# Patient Record
Sex: Female | Born: 1960 | Race: Black or African American | Hispanic: No | Marital: Married | Smoking: Never smoker
Health system: Southern US, Community
[De-identification: ages and names within clinical notes are randomized; demographics above are authoritative.]

## PROBLEM LIST (undated history)

## (undated) DIAGNOSIS — I10 Essential (primary) hypertension: Secondary | ICD-10-CM

## (undated) HISTORY — DX: Essential (primary) hypertension: I10

## (undated) HISTORY — PX: CHOLECYSTECTOMY: SHX55

---

## 1994-10-15 HISTORY — PX: ABDOMINAL HYSTERECTOMY: SHX81

## 2005-12-28 ENCOUNTER — Ambulatory Visit: Payer: Self-pay | Admitting: Family Medicine

## 2006-01-10 ENCOUNTER — Ambulatory Visit: Payer: Self-pay | Admitting: Family Medicine

## 2006-06-28 ENCOUNTER — Ambulatory Visit: Payer: Self-pay | Admitting: Family Medicine

## 2006-09-02 ENCOUNTER — Telehealth (INDEPENDENT_AMBULATORY_CARE_PROVIDER_SITE_OTHER): Payer: Self-pay | Admitting: *Deleted

## 2008-10-15 HISTORY — PX: OTHER SURGICAL HISTORY: SHX169

## 2011-03-29 LAB — LIPID PANEL
CHOLESTEROL: 175 mg/dL (ref 0–200)
HDL: 58 mg/dL (ref 35–70)
LDL Cholesterol: 102 mg/dL
Triglycerides: 76 mg/dL (ref 40–160)

## 2011-03-29 LAB — CBC AND DIFFERENTIAL
HEMOGLOBIN: 12.9 g/dL (ref 12.0–16.0)
WBC: 6.9 10^3/mL

## 2011-03-29 LAB — BASIC METABOLIC PANEL
CREATININE: 0.7 mg/dL (ref 0.5–1.1)
GLUCOSE: 91 mg/dL
Potassium: 3.7 mmol/L (ref 3.4–5.3)

## 2012-05-06 LAB — CBC AND DIFFERENTIAL
HEMOGLOBIN: 14 g/dL (ref 12.0–16.0)
WBC: 8.5 10^3/mL

## 2012-07-04 LAB — BASIC METABOLIC PANEL
Creatinine: 0.7 mg/dL (ref 0.5–1.1)
GLUCOSE: 86 mg/dL
POTASSIUM: 3.7 mmol/L (ref 3.4–5.3)

## 2012-07-04 LAB — TSH: TSH: 1.53 u[IU]/mL (ref 0.41–5.90)

## 2012-07-04 LAB — CBC AND DIFFERENTIAL
Hemoglobin: 13 g/dL (ref 12.0–16.0)
WBC: 7.1 10^3/mL

## 2012-11-17 LAB — HM MAMMOGRAPHY

## 2012-12-31 LAB — BASIC METABOLIC PANEL WITH GFR
Creatinine: 0.7 mg/dL (ref 0.5–1.1)
Glucose: 90 mg/dL
Potassium: 3.9 mmol/L (ref 3.4–5.3)

## 2012-12-31 LAB — CBC AND DIFFERENTIAL
Hemoglobin: 13.4 g/dL (ref 12.0–16.0)
WBC: 7.1 10*3/mL

## 2012-12-31 LAB — HEPATIC FUNCTION PANEL
ALT: 20 U/L (ref 7–35)
AST: 17 U/L (ref 13–35)

## 2012-12-31 LAB — LIPID PANEL
Cholesterol: 185 mg/dL (ref 0–200)
HDL: 64 mg/dL (ref 35–70)
LDL Cholesterol: 97 mg/dL
Triglycerides: 118 mg/dL (ref 40–160)

## 2012-12-31 LAB — CALCIUM: Calcium: 10.2 mg/dL

## 2013-05-29 LAB — HM COLONOSCOPY

## 2014-02-08 ENCOUNTER — Encounter: Payer: Self-pay | Admitting: Family Medicine

## 2014-02-08 ENCOUNTER — Ambulatory Visit (INDEPENDENT_AMBULATORY_CARE_PROVIDER_SITE_OTHER): Payer: 59 | Admitting: Family Medicine

## 2014-02-08 VITALS — BP 154/91 | HR 73 | Ht 61.0 in | Wt 134.0 lb

## 2014-02-08 DIAGNOSIS — I10 Essential (primary) hypertension: Secondary | ICD-10-CM

## 2014-02-08 DIAGNOSIS — E559 Vitamin D deficiency, unspecified: Secondary | ICD-10-CM | POA: Insufficient documentation

## 2014-02-08 HISTORY — DX: Essential (primary) hypertension: I10

## 2014-02-08 MED ORDER — VALSARTAN 80 MG PO TABS: 80.0000 mg | ORAL_TABLET | Freq: Every day | ORAL | Status: DC

## 2014-02-08 NOTE — Progress Notes (Signed)
CC: Penny Carpenter iGovernor Rookss a 53 y.o. female is here for Establish Care and Hypertension   Subjective: HPI:  Very pleasant 53 year old here to establish care  Reports a history of hypertension spanning back over a decade.  Over the past month she has noticed multiple readings at home in the stage I hypertension range never in stage II range.  Symptoms seem to be worse with caffeine elevated blood pressure. Nothing particularly makes better other than lisinopril and 10 years ago Diovan. States that she had to stop taking Diovan due to insurance reasons, she believes her blood pressure was much better controlled on this medication. Blood pressure to be elevated or normotensive anytime of the day, no predictability.  She reports a history of vitamin D deficiency found in January of this year. She denies any recent or remote fatigue, muscular skeletal pain, nor mood disturbance. She has been taking 1000-2000 units of vitamin D on a daily basis without side effects. Over the past 3 months she's also tried to spend more time outside.  Review of Systems - General ROS: negative for - chills, fever, night sweats, weight gain or weight loss Ophthalmic ROS: negative for - decreased vision Psychological ROS: negative for - anxiety or depression ENT ROS: negative for - hearing change, nasal congestion, tinnitus or allergies Hematological and Lymphatic ROS: negative for - bleeding problems, bruising or swollen lymph nodes Breast ROS: negative Respiratory ROS: no cough, shortness of breath, or wheezing Cardiovascular ROS: no chest pain or dyspnea on exertion Gastrointestinal ROS: no abdominal pain, change in bowel habits, or black or bloody stools Genito-Urinary ROS: negative for - genital discharge, genital ulcers, incontinence or abnormal bleeding from genitals Musculoskeletal ROS: negative for - joint pain or muscle pain Neurological ROS: negative for - headaches or memory loss Dermatological ROS: negative for  lumps, mole changes, rash and skin lesion changes  Past Medical History  Diagnosis Date  . Hypertension   . Essential hypertension 02/08/2014    Past Surgical History  Procedure Laterality Date  . Gallbladder removed  2010  . Abdominal hysterectomy  1996   Family History  Problem Relation Age of Onset  . Heart attack      grandparent  . Diabetes    . Hypertension      parents   . Hyperlipidemia      History   Social History  . Marital Status: Married    Spouse Name: N/A    Number of Children: N/A  . Years of Education: N/A   Occupational History  . Not on file.   Social History Main Topics  . Smoking status: Never Smoker   . Smokeless tobacco: Not on file  . Alcohol Use: 1.0 oz/week    2 drink(s) per week  . Drug Use: No  . Sexual Activity: Yes    Partners: Male   Other Topics Concern  . Not on file   Social History Narrative  . No narrative on file     Objective: BP 154/91  Pulse 73  Ht 5\' 1"  (1.549 m)  Wt 134 lb (60.782 kg)  BMI 25.33 kg/m2  General: Alert and Oriented, No Acute Distress HEENT: Pupils equal, round, reactive to light. Conjunctivae clear.  Moist membranes pharynx unremarkable. Neck supple without palpable thyromegaly Lungs: Clear to auscultation bilaterally, no wheezing/ronchi/rales.  Comfortable work of breathing. Good air movement. Cardiac: Regular rate and rhythm. Normal S1/S2.  No murmurs, rubs, nor gallops.   Extremities: No peripheral edema.  Strong peripheral pulses.  Mental Status: No depression, anxiety, nor agitation. Skin: Warm and dry.  Assessment & Plan: Penny Highlandamika was seen today for establish care and hypertension.  Diagnoses and associated orders for this visit:  Essential hypertension - valsartan (DIOVAN) 80 MG tablet; Take 1 tablet (80 mg total) by mouth daily.  Vitamin D deficiency    Vitamin D deficiency: Of urged her to have this rechecked today however she would prefer to wait until she needs routine labs this  summer, she will continue on vitamin D 1000-2000 units daily Essential hypertension: Uncontrolled chronic condition, stop lisinopril will restart valsartan, I've asked her to provide me with a weeks worth of daily blood pressures after she starts valsartan and if normotensive can push back followup in 3 months   Return in about 3 months (around 05/10/2014).

## 2014-02-15 ENCOUNTER — Telehealth: Payer: Self-pay | Admitting: *Deleted

## 2014-02-15 NOTE — Telephone Encounter (Signed)
Pt reports that her BP Friday was 116/82 and last pm it was 125/75

## 2014-02-15 NOTE — Telephone Encounter (Signed)
Penny Carpenter will you please let patient know that this is great news, BP now back under control.  I'd recommend she f/u in about three months four routine BP  F/u.  No need to come sooner unless new issues arise.

## 2014-02-16 NOTE — Telephone Encounter (Signed)
Left message on vm

## 2014-02-17 ENCOUNTER — Encounter: Payer: Self-pay | Admitting: Family Medicine

## 2014-02-17 DIAGNOSIS — Z0000 Encounter for general adult medical examination without abnormal findings: Secondary | ICD-10-CM | POA: Insufficient documentation

## 2014-02-26 ENCOUNTER — Encounter: Payer: Self-pay | Admitting: *Deleted

## 2014-03-17 ENCOUNTER — Other Ambulatory Visit: Payer: Self-pay

## 2014-03-17 DIAGNOSIS — I10 Essential (primary) hypertension: Secondary | ICD-10-CM

## 2014-03-17 MED ORDER — VALSARTAN 80 MG PO TABS: 80.0000 mg | ORAL_TABLET | Freq: Every day | ORAL | Status: DC

## 2014-03-17 NOTE — Telephone Encounter (Signed)
Sent prescription to pharmacy.  

## 2014-05-04 ENCOUNTER — Encounter: Payer: Self-pay | Admitting: Family Medicine

## 2014-05-04 ENCOUNTER — Ambulatory Visit (INDEPENDENT_AMBULATORY_CARE_PROVIDER_SITE_OTHER): Payer: 59 | Admitting: Family Medicine

## 2014-05-04 VITALS — BP 149/93 | HR 66 | Ht 61.0 in | Wt 131.0 lb

## 2014-05-04 DIAGNOSIS — E559 Vitamin D deficiency, unspecified: Secondary | ICD-10-CM

## 2014-05-04 DIAGNOSIS — Z1239 Encounter for other screening for malignant neoplasm of breast: Secondary | ICD-10-CM

## 2014-05-04 DIAGNOSIS — Z0000 Encounter for general adult medical examination without abnormal findings: Secondary | ICD-10-CM

## 2014-05-04 DIAGNOSIS — I10 Essential (primary) hypertension: Secondary | ICD-10-CM

## 2014-05-04 DIAGNOSIS — Z299 Encounter for prophylactic measures, unspecified: Secondary | ICD-10-CM

## 2014-05-04 NOTE — Patient Instructions (Signed)
Dr. Mosella Kasa's General Advice Following Your Complete Physical Exam  The Benefits of Regular Exercise: Unless you suffer from an uncontrolled cardiovascular condition, studies strongly suggest that regular exercise and physical activity will add to both the quality and length of your life.  The World Health Organization recommends 150 minutes of moderate intensity aerobic activity every week.  This is best split over 3-4 days a week, and can be as simple as a brisk walk for just over 35 minutes "most days of the week".  This type of exercise has been shown to lower LDL-Cholesterol, lower average blood sugars, lower blood pressure, lower cardiovascular disease risk, improve memory, and increase one's overall sense of wellbeing.  The addition of anaerobic (or "strength training") exercises offers additional benefits including but not limited to increased metabolism, prevention of osteoporosis, and improved overall cholesterol levels.  How Can I Strive For A Low-Fat Diet?: Current guidelines recommend that 25-35 percent of your daily energy (food) intake should come from fats.  One might ask how can this be achieved without having to dissect each meal on a daily basis?  Switch to skim or 1% milk instead of whole milk.  Focus on lean meats such as ground turkey, fresh fish, baked chicken, and lean cuts of beef as your source of dietary protein.  Limit saturated fat consumption to less than 10% of your daily caloric intake.  Limit trans fatty acid consumption primarily by limiting synthetic trans fats such as partially hydrogenated oils (Ex: fried fast foods).  Substitute olive or vegetable oil for solid fats where possible.  Moderation of Salt Intake: Provided you don't carry a diagnosis of congestive heart failure nor renal failure, I recommend a daily allowance of no more than 2300 mg of salt (sodium).  Keeping under this daily goal is associated with a decreased risk of cardiovascular events, creeping  above it can lead to elevated blood pressures and increases your risk of cardiovascular events.  Milligrams (mg) of salt is listed on all nutrition labels, and your daily intake can add up faster than you think.  Most canned and frozen dinners can pack in over half your daily salt allowance in one meal.    Lifestyle Health Risks: Certain lifestyle choices carry specific health risks.  As you may already know, tobacco use has been associated with increasing one's risk of cardiovascular disease, pulmonary disease, numerous cancers, among many other issues.  What you may not know is that there are medications and nicotine replacement strategies that can more than double your chances of successfully quitting.  I would be thrilled to help manage your quitting strategy if you currently use tobacco products.  When it comes to alcohol use, I've yet to find an "ideal" daily allowance.  Provided an individual does not have a medical condition that is exacerbated by alcohol consumption, general guidelines determine "safe drinking" as no more than two standard drinks for a man or no more than one standard drink for a female per day.  However, much debate still exists on whether any amount of alcohol consumption is technically "safe".  My general advice, keep alcohol consumption to a minimum for general health promotion.  If you or others believe that alcohol, tobacco, or recreational drug use is interfering with your life, I would be happy to provide confidential counseling regarding treatment options.  General "Over The Counter" Nutrition Advice: Postmenopausal women should aim for a daily calcium intake of 1200 mg, however a significant portion of this might already be   provided by diets including milk, yogurt, cheese, and other dairy products.  Vitamin D has been shown to help preserve bone density, prevent fatigue, and has even been shown to help reduce falls in the elderly.  Ensuring a daily intake of 800 Units of  Vitamin D is a good place to start to enjoy the above benefits, we can easily check your Vitamin D level to see if you'd potentially benefit from supplementation beyond 800 Units a day.  Folic Acid intake should be of particular concern to women of childbearing age.  Daily consumption of 400-800 mcg of Folic Acid is recommended to minimize the chance of spinal cord defects in a fetus should pregnancy occur.    For many adults, accidents still remain one of the most common culprits when it comes to cause of death.  Some of the simplest but most effective preventitive habits you can adopt include regular seatbelt use, proper helmet use, securing firearms, and regularly testing your smoke and carbon monoxide detectors.  Penny Ingalls B. Cedrick Partain DO [facility] [address], [address] [place],  [address] Phone: [phone]  

## 2014-05-04 NOTE — Progress Notes (Signed)
CC: Penny Carpenter is a 53 y.o. female is here for Annual Exam   Subjective: HPI:  Colonoscopy: 05/29/2013 normal with 10 year clearance Papsmear: 09/2011 normal no history of abnormal, we will recheck this in 2017 Mammogram: 11/2012 normal no history of abnormal she is overdue for a mammogram and a referral has been placed today  DEXA: 25(53 yo or postmenopausal with Fx, steroids, Fam Hip Fx, Smoker, EtOH abuse, RS, or secondary osteoporosis)  Influenza Vaccine: Encouraged her to have this when flu season arrives in September Pneumovax: No current indication Td/Tdap: Tdap  2012 Zoster: (Start 53 yo)  Returns for complete physical exam her blood pressures at home have consistently been below 140/90, she's been getting these numbers for a blood pressure cuff has been calibrated at her husband's PCPs office  No alcohol no tobacco no recreational drug use. She stays active on a daily basis with a walking regimen  Review of Systems - General ROS: negative for - chills, fever, night sweats, weight gain or weight loss Ophthalmic ROS: negative for - decreased vision Psychological ROS: negative for - anxiety or depression ENT ROS: negative for - hearing change, nasal congestion, tinnitus or allergies Hematological and Lymphatic ROS: negative for - bleeding problems, bruising or swollen lymph nodes Breast ROS: negative Respiratory ROS: no cough, shortness of breath, or wheezing Cardiovascular ROS: no chest pain or dyspnea on exertion Gastrointestinal ROS: no abdominal pain, change in bowel habits, or black or bloody stools Genito-Urinary ROS: negative for - genital discharge, genital ulcers, incontinence or abnormal bleeding from genitals Musculoskeletal ROS: negative for - joint pain or muscle pain Neurological ROS: negative for - headaches or memory loss Dermatological ROS: negative for lumps, mole changes, rash and skin lesion changes  Past Medical History  Diagnosis Date  . Hypertension    . Essential hypertension 02/08/2014    Past Surgical History  Procedure Laterality Date  . Gallbladder removed  2010  . Abdominal hysterectomy  1996    partial   Family History  Problem Relation Age of Onset  . Heart attack      grandparent  . Diabetes    . Hypertension      parents   . Hyperlipidemia      History   Social History  . Marital Status: Married    Spouse Name: N/A    Number of Children: N/A  . Years of Education: N/A   Occupational History  . Not on file.   Social History Main Topics  . Smoking status: Never Smoker   . Smokeless tobacco: Not on file  . Alcohol Use: 1.0 oz/week    2 drink(s) per week  . Drug Use: No  . Sexual Activity: Yes    Partners: Male   Other Topics Concern  . Not on file   Social History Narrative  . No narrative on file     Objective: BP 149/93  Pulse 66  Ht 5\' 1"  (1.549 m)  Wt 131 lb (59.421 kg)  BMI 24.76 kg/m2  General: No Acute Distress HEENT: Atraumatic, normocephalic, conjunctivae normal without scleral icterus.  No nasal discharge, hearing grossly intact, TMs with good landmarks bilaterally with no middle ear abnormalities, posterior pharynx clear without oral lesions. Neck: Supple, trachea midline, no cervical nor supraclavicular adenopathy. Pulmonary: Clear to auscultation bilaterally without wheezing, rhonchi, nor rales. Cardiac: Regular rate and rhythm.  No murmurs, rubs, nor gallops. No peripheral edema.  2+ peripheral pulses bilaterally. Abdomen: Bowel sounds normal.  No masses.  Non-tender without rebound.  Negative Murphy's sign. GU: Declined  MSK: Grossly intact, no signs of weakness.  Full strength throughout upper and lower extremities.  Full ROM in upper and lower extremities.  No midline spinal tenderness. Neuro: Gait unremarkable, CN II-XII grossly intact.  C5-C6 Reflex 2/4 Bilaterally, L4 Reflex 2/4 Bilaterally.  Cerebellar function intact. Skin: No rashes. Psych: Alert and oriented to  person/place/time.  Thought process normal. No anxiety/depression.   Assessment & Plan: Penny Carpenter was seen today for annual exam.  Diagnoses and associated orders for this visit:  Annual physical exam  Vitamin D deficiency - Vit D  25 hydroxy (rtn osteoporosis monitoring)  Preventive measure  Screening for breast cancer - MM DIGITAL SCREENING BILATERAL; Future  Essential hypertension    Healthy lifestyle interventions including but not limited to regular exercise, a healthy low fat diet, moderation of salt intake, the dangers of tobacco/alcohol/recreational drug use, nutrition supplementation, and accident avoidance were discussed with the patient and a handout was provided for future reference.  Treating her central hypertension as white coat hypertension I've asked her to return in 3-6 months with a blood pressure log to further confirm white coat hypertension  Return in about 3 months (around 08/04/2014) for BP Follow Up.

## 2014-05-05 LAB — VITAMIN D 25 HYDROXY (VIT D DEFICIENCY, FRACTURES): VIT D 25 HYDROXY: 43 ng/mL (ref 30–89)

## 2014-05-24 ENCOUNTER — Other Ambulatory Visit: Payer: Self-pay | Admitting: *Deleted

## 2014-05-24 DIAGNOSIS — I10 Essential (primary) hypertension: Secondary | ICD-10-CM

## 2014-05-24 MED ORDER — VALSARTAN 80 MG PO TABS: 80.0000 mg | ORAL_TABLET | Freq: Every day | ORAL | Status: DC

## 2014-05-25 ENCOUNTER — Ambulatory Visit (INDEPENDENT_AMBULATORY_CARE_PROVIDER_SITE_OTHER): Payer: 59

## 2014-05-25 ENCOUNTER — Ambulatory Visit: Payer: 59

## 2014-05-25 DIAGNOSIS — Z1239 Encounter for other screening for malignant neoplasm of breast: Secondary | ICD-10-CM

## 2014-06-15 ENCOUNTER — Encounter: Payer: Self-pay | Admitting: Family Medicine

## 2014-06-15 DIAGNOSIS — N6320 Unspecified lump in the left breast, unspecified quadrant: Secondary | ICD-10-CM | POA: Insufficient documentation

## 2014-06-16 ENCOUNTER — Other Ambulatory Visit: Payer: Self-pay | Admitting: Family Medicine

## 2014-06-16 DIAGNOSIS — R928 Other abnormal and inconclusive findings on diagnostic imaging of breast: Secondary | ICD-10-CM

## 2014-06-25 ENCOUNTER — Ambulatory Visit
Admission: RE | Admit: 2014-06-25 | Discharge: 2014-06-25 | Disposition: A | Discharge status: Home / Self Care | Payer: 59 | Source: Ambulatory Visit | Attending: Family Medicine | Admitting: Family Medicine

## 2014-06-25 ENCOUNTER — Encounter (INDEPENDENT_AMBULATORY_CARE_PROVIDER_SITE_OTHER): Payer: Self-pay

## 2014-06-25 DIAGNOSIS — R928 Other abnormal and inconclusive findings on diagnostic imaging of breast: Secondary | ICD-10-CM

## 2014-07-14 ENCOUNTER — Telehealth: Payer: Self-pay | Admitting: *Deleted

## 2014-07-14 MED ORDER — HYDROCHLOROTHIAZIDE 25 MG PO TABS: ORAL_TABLET | ORAL | Status: DC

## 2014-07-14 NOTE — Telephone Encounter (Signed)
Pt is requesting a rx for a "fluid pill." she states she is having swelling in fingers and ankles

## 2014-07-14 NOTE — Telephone Encounter (Signed)
Faxed to [facility] in [place] per pt's request advised if the meds don't help or she has any other sxs then call us back. ( pt was doing a detox program in which she was limiting her salt intake and recently stopped doing that )

## 2014-07-14 NOTE — Telephone Encounter (Signed)
Penny Carpenter, Rx placed in in-box ready for pickup/faxing. Unsure if she wanted local pharmacy or mail order

## 2014-08-04 ENCOUNTER — Encounter: Payer: Self-pay | Admitting: Family Medicine

## 2014-08-04 ENCOUNTER — Ambulatory Visit (INDEPENDENT_AMBULATORY_CARE_PROVIDER_SITE_OTHER): Payer: 59 | Admitting: Family Medicine

## 2014-08-04 VITALS — BP 147/90 | HR 66 | Temp 98.5°F | Ht 61.0 in | Wt 130.0 lb

## 2014-08-04 DIAGNOSIS — R202 Paresthesia of skin: Secondary | ICD-10-CM

## 2014-08-04 DIAGNOSIS — Z91048 Other nonmedicinal substance allergy status: Secondary | ICD-10-CM

## 2014-08-04 DIAGNOSIS — I10 Essential (primary) hypertension: Secondary | ICD-10-CM

## 2014-08-04 DIAGNOSIS — Z9109 Other allergy status, other than to drugs and biological substances: Secondary | ICD-10-CM | POA: Insufficient documentation

## 2014-08-04 MED ORDER — MONTELUKAST SODIUM 10 MG PO TABS: 10.0000 mg | ORAL_TABLET | Freq: Every day | ORAL | Status: DC

## 2014-08-04 MED ORDER — VALSARTAN 80 MG PO TABS: 80.0000 mg | ORAL_TABLET | Freq: Every day | ORAL | Status: DC

## 2014-08-04 MED ORDER — HYDROCHLOROTHIAZIDE 25 MG PO TABS: ORAL_TABLET | ORAL | Status: DC

## 2014-08-04 NOTE — Progress Notes (Signed)
CC: Penny Carpenter is a 53 y.o. female is here for Follow-up   Subjective: HPI:  Followup hypertension: Since starting hydrochlorothiazide and valsartan she has had  normotensive blood pressures at home 99% of the time it has been taking. There is one occasion where it was staged to hypertension after she indulged in caffeinated beverages and did not monitor her sodium intake. She denies chest pain shortness of breath orthopnea nor peripheral edema  Requesting refills on Singulair. Provided she takes this on a daily basis she denies any nasal congestion, itching, nor respiratory complaints.  She complains of a mild numbness on the back of her neck localized in the midline and is nonradiating. The area is described as small. Nothing seems to make it better or worse and he began soon after she began a weightlifting regimen involving the upper extremities and trapezius muscles. Symptoms are mild in severity and have been unchanged over the last 2 months. She denies motor or sensory disturbances elsewhere. Denies neck pain, shoulder pain, nor back pain. No interventions as of yet  Review Of Systems Outlined In HPI  Past Medical History  Diagnosis Date  . Hypertension   . Essential hypertension 02/08/2014    Past Surgical History  Procedure Laterality Date  . Gallbladder removed  2010  . Abdominal hysterectomy  1996    partial   Family History  Problem Relation Age of Onset  . Heart attack      grandparent  . Diabetes    . Hypertension      parents   . Hyperlipidemia      History   Social History  . Marital Status: Married    Spouse Name: N/A    Number of Children: N/A  . Years of Education: N/A   Occupational History  . Not on file.   Social History Main Topics  . Smoking status: Never Smoker   . Smokeless tobacco: Not on file  . Alcohol Use: 1.0 oz/week    2 drink(s) per week  . Drug Use: No  . Sexual Activity: Yes    Partners: Male   Other Topics Concern  . Not on  file   Social History Narrative  . No narrative on file     Objective: BP 147/90  Pulse 66  Temp(Src) 98.5 F (36.9 C)  Ht 5\' 1"  (1.549 m)  Wt 130 lb (58.968 kg)  BMI 24.58 kg/m2  General: Alert and Oriented, No Acute Distress HEENT: Pupils equal, round, reactive to light. Conjunctivae clear.  Moist because membranes pharynx unremarkable. Lungs: Clear to auscultation bilaterally, no wheezing/ronchi/rales.  Comfortable work of breathing. Good air movement. Cardiac: Regular rate and rhythm. Normal S1/S2.  No murmurs, rubs, nor gallops.   Back: No midline spinous process tenderness in the cervical region. She has full range of motion and strength in all 3 planes of the cervical spine. There is no overlying skin changes at the site of where she describes her paresthesia. Light touch sensation in this region is intact. Spurling's negative bilaterally. Extremities: No peripheral edema.  Strong peripheral pulses.  Mental Status: No depression, anxiety, nor agitation. Skin: Warm and dry.  Assessment & Plan: Penny Carpenter was seen today for follow-up.  Diagnoses and associated orders for this visit:  Essential hypertension - valsartan (DIOVAN) 80 MG tablet; Take 1 tablet (80 mg total) by mouth daily.  Environmental allergies - montelukast (SINGULAIR) 10 MG tablet; Take 1 tablet (10 mg total) by mouth at bedtime.  Paresthesia  Other Orders -  hydrochlorothiazide (HYDRODIURIL) 25 MG tablet; One tablet by mouth every morning prn swelling.    Essential hypertension: Controlled continue valsartan and hydrochlorothiazide Environmental allergens: Controlled continue Singulair Paresthesia: Reassurance provided I do not believe this represents any serious neurologic compromise. Joint decision to take a watch and wait approach before further testing.   Return in about 6 months (around 02/03/2015) for BP follow up.

## 2015-01-17 ENCOUNTER — Other Ambulatory Visit: Payer: Self-pay | Admitting: Family Medicine

## 2015-02-03 ENCOUNTER — Ambulatory Visit: Payer: 59 | Admitting: Family Medicine

## 2015-02-13 ENCOUNTER — Other Ambulatory Visit: Payer: Self-pay | Admitting: Family Medicine

## 2015-02-15 ENCOUNTER — Ambulatory Visit (INDEPENDENT_AMBULATORY_CARE_PROVIDER_SITE_OTHER): Payer: 59 | Admitting: Family Medicine

## 2015-02-15 ENCOUNTER — Encounter: Payer: Self-pay | Admitting: Family Medicine

## 2015-02-15 VITALS — BP 108/73 | HR 72 | Ht 61.0 in | Wt 137.0 lb

## 2015-02-15 DIAGNOSIS — Z91048 Other nonmedicinal substance allergy status: Secondary | ICD-10-CM | POA: Diagnosis not present

## 2015-02-15 DIAGNOSIS — Z9109 Other allergy status, other than to drugs and biological substances: Secondary | ICD-10-CM

## 2015-02-15 DIAGNOSIS — I10 Essential (primary) hypertension: Secondary | ICD-10-CM | POA: Diagnosis not present

## 2015-02-15 DIAGNOSIS — R609 Edema, unspecified: Secondary | ICD-10-CM | POA: Diagnosis not present

## 2015-02-15 MED ORDER — MONTELUKAST SODIUM 10 MG PO TABS: 10.0000 mg | ORAL_TABLET | Freq: Every day | ORAL | Status: DC

## 2015-02-15 MED ORDER — HYDROCHLOROTHIAZIDE 25 MG PO TABS: ORAL_TABLET | ORAL | Status: DC

## 2015-02-15 NOTE — Progress Notes (Signed)
CC: Penny Carpenter is a 54 y.o. female is here for Follow-up   Subjective: HPI:  Follow-up essential hypertension:  continues to take valsartan 80 mg daily. She takes hydrochlorothiazide primarily for edema but this also helps with blood pressure, she only takes this 3-4 times a week as needed for edema. She's been checking her blood pressures at home and over 99% blood pressures are in the normotensive range. She had one or 2 stage I hypertension readings however this was on days that she was getting dental procedures or root canal and she was admittedly quite anxious. Over the past few weeks she's been getting lightheaded when ascending vertical positions quickly. She denies chest pain shortness of breath orthopnea nor motor or sensory disturbances other than that described above  Requesting a refill on singular. Provided she uses this on a daily basis she has only mild nasal congestion and itchy eyes. Symptoms are worse during high pollen count days. She's also using a nasal steroid most weeks of the month. She denies any cough, wheezing, nor skin changes such as a rash  Follow-up edema: She's noticed that if she increases her sodium intake she'll get swelling in both ankles which is symmetric and provides her with a mild but annoying tightness sensation around the ankles symptoms are improved if she takes hydrochlorothiazide in the morning. She denies edema elsewhere.   Review Of Systems Outlined In HPI  Past Medical History  Diagnosis Date  . Hypertension   . Essential hypertension 02/08/2014    Past Surgical History  Procedure Laterality Date  . Gallbladder removed  2010  . Abdominal hysterectomy  1996    partial   Family History  Problem Relation Age of Onset  . Heart attack      grandparent  . Diabetes    . Hypertension      parents   . Hyperlipidemia      History   Social History  . Marital Status: Married    Spouse Name: N/A  . Number of Children: N/A  . Years of  Education: N/A   Occupational History  . Not on file.   Social History Main Topics  . Smoking status: Never Smoker   . Smokeless tobacco: Not on file  . Alcohol Use: 1.0 oz/week    2 drink(s) per week  . Drug Use: No  . Sexual Activity:    Partners: Male   Other Topics Concern  . Not on file   Social History Narrative     Objective: BP 108/73 mmHg  Pulse 72  Ht  (1.549 m)  Wt 137 lb (62.143 kg)  BMI 25.90 kg/m2  General: Alert and Oriented, No Acute Distress HEENT: Pupils equal, round, reactive to light. Conjunctivae clear.  Moist mucous membranes Neck supple without palpable lymphadenopathy nor abnormal masses. Lungs: Clear to auscultation bilaterally, no wheezing/ronchi/rales.  Comfortable work of breathing. Good air movement. Cardiac: Regular rate and rhythm. Normal S1/S2.  No murmurs, rubs, nor gallops.   Extremities: No peripheral edema.  Strong peripheral pulses.  Mental Status: No depression, anxiety, nor agitation. Skin: Warm and dry.  Assessment & Plan: Penny Carpenter was seen today for follow-up.  Diagnoses and all orders for this visit:  Essential hypertension  Environmental allergies Orders: -     montelukast (SINGULAIR) 10 MG tablet; Take 1 tablet (10 mg total) by mouth at bedtime.  Edema  Other orders -     hydrochlorothiazide (HYDRODIURIL) 25 MG tablet; TAKE 1 TABLET EVERY MORNING AS  NEEDED FOR SWELLING   Essential hypertension: Controlled but lightheadedness makes me think that she is overmedicated. Decreasing valsartan continue as needed hydrochlorothiazide Allergies: Controlled on Singulair and nasal steroid Edema: Controlled on as needed hydrochlorothiazide   Return in about 3 months (around 05/18/2015) for BP.

## 2015-05-15 ENCOUNTER — Other Ambulatory Visit: Payer: Self-pay | Admitting: Family Medicine

## 2015-05-18 ENCOUNTER — Ambulatory Visit: Payer: 59 | Admitting: Family Medicine

## 2015-06-06 ENCOUNTER — Other Ambulatory Visit: Payer: Self-pay | Admitting: Family Medicine

## 2015-06-06 DIAGNOSIS — Z1231 Encounter for screening mammogram for malignant neoplasm of breast: Secondary | ICD-10-CM

## 2015-06-29 ENCOUNTER — Ambulatory Visit (INDEPENDENT_AMBULATORY_CARE_PROVIDER_SITE_OTHER): Payer: 59

## 2015-06-29 DIAGNOSIS — Z1231 Encounter for screening mammogram for malignant neoplasm of breast: Secondary | ICD-10-CM | POA: Diagnosis not present

## 2015-08-03 ENCOUNTER — Ambulatory Visit (INDEPENDENT_AMBULATORY_CARE_PROVIDER_SITE_OTHER): Payer: 59 | Admitting: Family Medicine

## 2015-08-03 ENCOUNTER — Encounter: Payer: Self-pay | Admitting: Family Medicine

## 2015-08-03 ENCOUNTER — Other Ambulatory Visit (HOSPITAL_COMMUNITY)
Admission: RE | Admit: 2015-08-03 | Discharge: 2015-08-03 | Disposition: A | Discharge status: Home / Self Care | Payer: 59 | Source: Ambulatory Visit | Attending: Family Medicine | Admitting: Family Medicine

## 2015-08-03 VITALS — BP 132/79 | HR 67 | Wt 134.0 lb

## 2015-08-03 DIAGNOSIS — Z01419 Encounter for gynecological examination (general) (routine) without abnormal findings: Secondary | ICD-10-CM | POA: Insufficient documentation

## 2015-08-03 DIAGNOSIS — W5503XA Scratched by cat, initial encounter: Secondary | ICD-10-CM

## 2015-08-03 DIAGNOSIS — T148 Other injury of unspecified body region: Secondary | ICD-10-CM | POA: Diagnosis not present

## 2015-08-03 DIAGNOSIS — Z0000 Encounter for general adult medical examination without abnormal findings: Secondary | ICD-10-CM

## 2015-08-03 MED ORDER — AMOXICILLIN-POT CLAVULANATE 500-125 MG PO TABS: ORAL_TABLET | ORAL | Status: AC

## 2015-08-03 MED ORDER — MELATONIN 3 MG PO TABS: ORAL_TABLET | ORAL | Status: DC

## 2015-08-03 NOTE — Progress Notes (Signed)
CC: Penny Carpenter is a 54 y.o. female is here for Annual Exam   Subjective: HPI:  Colonoscopy: 05/29/2013 normal with 10 year clearance Papsmear: 09/2011 normal no history of abnormal, we will recheck this today per her preference. Mammogram: Normal 06/2015 repeat one year  Influenza Vaccine: UTD Pneumovax: No current indication Td/Tdap: Tdap 2012 UTD Zoster: (Start 54 yo)  Requesting complete physical exam with no acute complaints  Other than a scratch on the left forearm that occurred yesterday after getting scratch from a wild kitten. Redness has slowly been improving. She denies streaking or fevers.  Review of Systems - General ROS: negative for - chills, fever, night sweats, weight gain or weight loss Ophthalmic ROS: negative for - decreased vision Psychological ROS: negative for - anxiety or depression ENT ROS: negative for - hearing change, nasal congestion, tinnitus or allergies Hematological and Lymphatic ROS: negative for - bleeding problems, bruising or swollen lymph nodes Breast ROS: negative Respiratory ROS: no cough, shortness of breath, or wheezing Cardiovascular ROS: no chest pain or dyspnea on exertion Gastrointestinal ROS: no abdominal pain, change in bowel habits, or black or bloody stools Genito-Urinary ROS: negative for - genital discharge, genital ulcers, incontinence or abnormal bleeding from genitals Musculoskeletal ROS: negative for - joint pain or muscle pain Neurological ROS: negative for - headaches or memory loss Dermatological ROS: negative for lumps, mole changes, rash and skin lesion changes  Past Medical History  Diagnosis Date  . Hypertension   . Essential hypertension 02/08/2014    Past Surgical History  Procedure Laterality Date  . Gallbladder removed  2010  . Abdominal hysterectomy  1996    partial   Family History  Problem Relation Age of Onset  . Heart attack      grandparent  . Diabetes    . Hypertension      parents   .  Hyperlipidemia      Social History   Social History  . Marital Status: Married    Spouse Name: N/A  . Number of Children: N/A  . Years of Education: N/A   Occupational History  . Not on file.   Social History Main Topics  . Smoking status: Never Smoker   . Smokeless tobacco: Not on file  . Alcohol Use: 1.0 oz/week    2 drink(s) per week  . Drug Use: No  . Sexual Activity:    Partners: Male   Other Topics Concern  . Not on file   Social History Narrative     Objective: BP 132/79 mmHg  Pulse 67  Wt 134 lb (60.782 kg)  General: No Acute Distress HEENT: Atraumatic, normocephalic, conjunctivae normal without scleral icterus.  No nasal discharge, hearing grossly intact, TMs with good landmarks bilaterally with no middle ear abnormalities, posterior pharynx clear without oral lesions. Neck: Supple, trachea midline, no cervical nor supraclavicular adenopathy. Pulmonary: Clear to auscultation bilaterally without wheezing, rhonchi, nor rales. Cardiac: Regular rate and rhythm.  No murmurs, rubs, nor gallops. No peripheral edema.  2+ peripheral pulses bilaterally. Abdomen: Bowel sounds normal.  No masses.  Non-tender without rebound.  Negative Murphy's sign. ZO:XWRUE majora & minora without lesions.  Distal urethra unremarkable.  Vaginal wall integrity preserved without mucosal lesions.  Cervix absent however there does appear to be a vaginal cuff from prior hysterectomy. MSK: Grossly intact, no signs of weakness.  Full strength throughout upper and lower extremities.  Full ROM in upper and lower extremities.  No midline spinal tenderness. Neuro: Gait unremarkable, CN II-XII  grossly intact.  C5-C6 Reflex 2/4 Bilaterally, L4 Reflex 2/4 Bilaterally.  Cerebellar function intact. Skin: No rashes. Mild superficial scratch on the left forearm approximately 3 cm in length Psych: Alert and oriented to person/place/time.  Thought process normal. No anxiety/depression.   Assessment &  Plan: [name] was seen today for annual exam.  Diagnoses and all orders for this visit:  Annual physical exam -     Cytology - PAP -     Lipid panel -     COMPLETE METABOLIC PANEL WITH GFR -     CBC  Cat scratch -     amoxicillin-clavulanate (AUGMENTIN) 500-125 MG tablet; Take one by mouth every 8 hours for ten total days.  Other orders -     Melatonin 3 MG TABS; One by mouth at bedtime.   Healthy lifestyle interventions including but not limited to regular exercise, a healthy low fat diet, moderation of salt intake, the dangers of tobacco/alcohol/recreational drug use, nutrition supplementation, and accident avoidance were discussed with the patient and a handout was provided for future reference.  She wanted recommendations on natural medicines to help with sleep, discussed melatonin.  Discussed signs/symmptoms of cellulitits that would require starting augmentin however right now the wound looks clean and not infected   Return in about 6 months (around 02/01/2016) for BP.

## 2015-08-04 LAB — CYTOLOGY - PAP

## 2015-08-09 LAB — COMPLETE METABOLIC PANEL WITH GFR
ALBUMIN: 3.9 g/dL (ref 3.6–5.1)
ALT: 17 U/L (ref 6–29)
AST: 19 U/L (ref 10–35)
Alkaline Phosphatase: 87 U/L (ref 33–130)
BILIRUBIN TOTAL: 0.6 mg/dL (ref 0.2–1.2)
BUN: 8 mg/dL (ref 7–25)
CO2: 31 mmol/L (ref 20–31)
CREATININE: 0.64 mg/dL (ref 0.50–1.05)
Calcium: 9.5 mg/dL (ref 8.6–10.4)
Chloride: 97 mmol/L — ABNORMAL LOW (ref 98–110)
GFR, Est African American: >89 mL/min (ref >=60)
GFR, Est Non African American: >89 mL/min (ref >=60)
Glucose, Bld: 91 mg/dL (ref 65–99)
Potassium: 4.3 mmol/L (ref 3.5–5.3)
SODIUM: 137 mmol/L (ref 135–146)
TOTAL PROTEIN: 6.9 g/dL (ref 6.1–8.1)

## 2015-08-09 LAB — CBC
HCT: 39.2 % (ref 36.0–46.0)
Hemoglobin: 13.3 g/dL (ref 12.0–15.0)
MCH: 29.2 pg (ref 26.0–34.0)
MCHC: 33.9 g/dL (ref 30.0–36.0)
MCV: 86 fL (ref 78.0–100.0)
MPV: 9.9 fL (ref 8.6–12.4)
PLATELETS: 325 10*3/uL (ref 150–400)
RBC: 4.56 MIL/uL (ref 3.87–5.11)
RDW: 14.2 % (ref 11.5–15.5)
WBC: 8.1 10*3/uL (ref 4.0–10.5)

## 2015-08-09 LAB — LIPID PANEL
Cholesterol: 155 mg/dL (ref 125–200)
HDL: 57 mg/dL (ref >=46)
LDL Cholesterol: 74 mg/dL (ref <130)
Total CHOL/HDL Ratio: 2.7 Ratio (ref <=5.0)
Triglycerides: 121 mg/dL (ref <150)
VLDL: 24 mg/dL (ref <30)

## 2015-09-22 ENCOUNTER — Other Ambulatory Visit: Payer: Self-pay | Admitting: Family Medicine

## 2015-11-11 ENCOUNTER — Other Ambulatory Visit: Payer: Self-pay | Admitting: Family Medicine

## 2015-12-19 ENCOUNTER — Other Ambulatory Visit: Payer: Self-pay | Admitting: Family Medicine

## 2016-03-16 ENCOUNTER — Other Ambulatory Visit: Payer: Self-pay | Admitting: Family Medicine

## 2016-05-09 ENCOUNTER — Other Ambulatory Visit: Payer: Self-pay | Admitting: Family Medicine

## 2016-06-21 ENCOUNTER — Other Ambulatory Visit: Payer: Self-pay | Admitting: Osteopathic Medicine

## 2016-06-21 DIAGNOSIS — Z1231 Encounter for screening mammogram for malignant neoplasm of breast: Secondary | ICD-10-CM

## 2016-07-06 ENCOUNTER — Ambulatory Visit (INDEPENDENT_AMBULATORY_CARE_PROVIDER_SITE_OTHER): Payer: 59 | Admitting: Osteopathic Medicine

## 2016-07-06 ENCOUNTER — Ambulatory Visit (INDEPENDENT_AMBULATORY_CARE_PROVIDER_SITE_OTHER): Payer: 59

## 2016-07-06 VITALS — BP 152/90 | HR 70

## 2016-07-06 DIAGNOSIS — Z23 Encounter for immunization: Secondary | ICD-10-CM | POA: Diagnosis not present

## 2016-07-06 DIAGNOSIS — Z1231 Encounter for screening mammogram for malignant neoplasm of breast: Secondary | ICD-10-CM

## 2016-07-06 DIAGNOSIS — Z0000 Encounter for general adult medical examination without abnormal findings: Secondary | ICD-10-CM

## 2016-07-06 DIAGNOSIS — I10 Essential (primary) hypertension: Secondary | ICD-10-CM

## 2016-07-06 DIAGNOSIS — Z91048 Other nonmedicinal substance allergy status: Secondary | ICD-10-CM

## 2016-07-06 DIAGNOSIS — Z9109 Other allergy status, other than to drugs and biological substances: Secondary | ICD-10-CM

## 2016-07-06 LAB — COMPLETE METABOLIC PANEL WITH GFR
ALT: 26 U/L (ref 6–29)
AST: 26 U/L (ref 10–35)
Albumin: 4.2 g/dL (ref 3.6–5.1)
Alkaline Phosphatase: 80 U/L (ref 33–130)
BUN: 13 mg/dL (ref 7–25)
CALCIUM: 9.4 mg/dL (ref 8.6–10.4)
CHLORIDE: 103 mmol/L (ref 98–110)
CO2: 28 mmol/L (ref 20–31)
CREATININE: 0.74 mg/dL (ref 0.50–1.05)
GFR, Est Non African American: >89 mL/min (ref >=60)
Glucose, Bld: 92 mg/dL (ref 65–99)
POTASSIUM: 3.7 mmol/L (ref 3.5–5.3)
Sodium: 141 mmol/L (ref 135–146)
Total Bilirubin: 0.5 mg/dL (ref 0.2–1.2)
Total Protein: 7.2 g/dL (ref 6.1–8.1)

## 2016-07-06 LAB — CBC WITH DIFFERENTIAL/PLATELET
BASOS ABS: 0 {cells}/uL (ref 0–200)
BASOS PCT: 0 %
EOS ABS: 144 {cells}/uL (ref 15–500)
Eosinophils Relative: 2 %
HCT: 40.7 % (ref 35.0–45.0)
HEMOGLOBIN: 13.6 g/dL (ref 11.7–15.5)
Lymphocytes Relative: 48 %
Lymphs Abs: 3456 cells/uL (ref 850–3900)
MCH: 28.9 pg (ref 27.0–33.0)
MCHC: 33.4 g/dL (ref 32.0–36.0)
MCV: 86.4 fL (ref 80.0–100.0)
MONO ABS: 432 {cells}/uL (ref 200–950)
MONOS PCT: 6 %
MPV: 10.2 fL (ref 7.5–12.5)
NEUTROS ABS: 3168 {cells}/uL (ref 1500–7800)
Neutrophils Relative %: 44 %
Platelets: 327 10*3/uL (ref 140–400)
RBC: 4.71 MIL/uL (ref 3.80–5.10)
RDW: 13.6 % (ref 11.0–15.0)
WBC: 7.2 10*3/uL (ref 3.8–10.8)

## 2016-07-06 LAB — LIPID PANEL
CHOL/HDL RATIO: 2.4 ratio (ref <=5.0)
Cholesterol: 175 mg/dL (ref 125–200)
HDL: 72 mg/dL (ref >=46)
LDL Cholesterol: 86 mg/dL (ref <130)
TRIGLYCERIDES: 83 mg/dL (ref <150)
VLDL: 17 mg/dL (ref <30)

## 2016-07-06 LAB — TSH: TSH: 3.38 m[IU]/L

## 2016-07-06 MED ORDER — MONTELUKAST SODIUM 10 MG PO TABS: 10.0000 mg | ORAL_TABLET | Freq: Every day | ORAL | 3 refills | Status: DC

## 2016-07-06 NOTE — Patient Instructions (Addendum)
Due for Tetanus booster 03/2021 No more Pap! Mammogram every year Colonoscopy to repeat in 05/2023 Age 55: shingles vaccine Flu shot every fall/winter  Annual blood work and checkup   Recommend you bring your home BP cuff to the office for verification - if your cuff matches ours, great! If your cuff is inaccurate, we may need to come up with a new plan for your medications.
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
## 2016-07-07 LAB — HEPATITIS C ANTIBODY: HCV AB: NEGATIVE

## 2016-07-07 LAB — HIV ANTIBODY (ROUTINE TESTING W REFLEX): HIV: NONREACTIVE

## 2016-07-07 LAB — VITAMIN D 25 HYDROXY (VIT D DEFICIENCY, FRACTURES): Vit D, 25-Hydroxy: 32 ng/mL (ref 30–100)

## 2016-07-09 ENCOUNTER — Encounter: Payer: Self-pay | Admitting: Osteopathic Medicine

## 2016-07-09 ENCOUNTER — Ambulatory Visit (INDEPENDENT_AMBULATORY_CARE_PROVIDER_SITE_OTHER): Payer: 59 | Admitting: Osteopathic Medicine

## 2016-07-09 VITALS — BP 171/101 | HR 73

## 2016-07-09 DIAGNOSIS — I10 Essential (primary) hypertension: Secondary | ICD-10-CM | POA: Diagnosis not present

## 2016-07-09 MED ORDER — VALSARTAN 80 MG PO TABS: 40.0000 mg | ORAL_TABLET | Freq: Every day | ORAL | 0 refills | Status: DC

## 2016-07-09 NOTE — Progress Notes (Signed)
  Main purpose of today's nurse visit for blood pressure check was to confirm home monitor. Since we can trust the numbers she is getting on her home monitor I would advise that we continue to just have her bring her readings into the office, would encourage her to bring home blood pressure cuff to all visits.  Per my review of the records, she has not had a refill of the valsartan in quite some time. Last prescription was 05/09/2016 I went ahead and ordered a refill.  Patient is persistently having headaches, would recommend that she follow-up in the office for repeat blood pressure check. If her home numbers are getting greater than 140/90 consistently would recommend that she increase to 1 tablet of valsartan daily.

## 2016-07-09 NOTE — Progress Notes (Signed)
Pt presents to the clinic for comparison of home BP cuff with in office BP cuff.  The readings of both BP cuffs were close in numbers.  Home BP read 171/102 and office BP read 171/101.  Pt c/o headache.  She reports that she did take 1 tablet of hydrochlorothiazide and and half tablet of valsartan. She believes her BP is high due to a stressful phone call she received about her dad prior to her appointment.  I advised pt to take other half of valsartan if Sx do not improve.  Pt verbalized understanding. -EMH/RMA

## 2016-07-11 ENCOUNTER — Other Ambulatory Visit: Payer: Self-pay | Admitting: Osteopathic Medicine

## 2016-07-11 DIAGNOSIS — R928 Other abnormal and inconclusive findings on diagnostic imaging of breast: Secondary | ICD-10-CM

## 2016-07-11 NOTE — Progress Notes (Signed)
Pt.notified

## 2016-07-17 ENCOUNTER — Encounter: Payer: Self-pay | Admitting: Family Medicine

## 2016-07-20 ENCOUNTER — Encounter: Payer: Self-pay | Admitting: Family Medicine

## 2016-07-20 ENCOUNTER — Ambulatory Visit
Admission: RE | Admit: 2016-07-20 | Discharge: 2016-07-20 | Disposition: A | Discharge status: Home / Self Care | Payer: 59 | Source: Ambulatory Visit | Attending: Osteopathic Medicine | Admitting: Osteopathic Medicine

## 2016-07-20 DIAGNOSIS — R928 Other abnormal and inconclusive findings on diagnostic imaging of breast: Secondary | ICD-10-CM

## 2016-08-07 ENCOUNTER — Telehealth: Payer: Self-pay

## 2016-08-07 DIAGNOSIS — I10 Essential (primary) hypertension: Secondary | ICD-10-CM

## 2016-08-07 NOTE — Telephone Encounter (Signed)
Patient request a referral for Nutritionist   Roshunda Keir,CMA

## 2016-08-08 NOTE — Telephone Encounter (Signed)
Spoke to patient advised her that referral was placed. Penny Carpenter,CMA

## 2016-08-08 NOTE — Telephone Encounter (Signed)
I placed referral for nutritionist. In a patient without current documented diagnosis of obesity or diabetes, I'm not sure that nutrition will be covered. I ordered it under the diagnosis of hypertension. Was there specific reason that the patient wanted to see a nutritionist?

## 2016-08-10 ENCOUNTER — Encounter: Payer: Self-pay | Admitting: Osteopathic Medicine

## 2016-08-10 ENCOUNTER — Ambulatory Visit (INDEPENDENT_AMBULATORY_CARE_PROVIDER_SITE_OTHER): Payer: 59 | Admitting: Osteopathic Medicine

## 2016-08-10 VITALS — BP 172/91 | HR 77 | Wt 133.0 lb

## 2016-08-10 DIAGNOSIS — I10 Essential (primary) hypertension: Secondary | ICD-10-CM | POA: Diagnosis not present

## 2016-08-10 MED ORDER — VALSARTAN 80 MG PO TABS: 80.0000 mg | ORAL_TABLET | Freq: Every day | ORAL | 1 refills | Status: DC

## 2016-08-10 MED ORDER — HYDROCHLOROTHIAZIDE 25 MG PO TABS: 25.0000 mg | ORAL_TABLET | Freq: Every day | ORAL | 0 refills | Status: DC

## 2016-08-10 NOTE — Progress Notes (Signed)
Nurse notes reviewed. Spoke with nurse today as well prior to patient being sent home. Home BP cuff not entirely accurate. Would take HCTZ daily, follow-up with me in 1-2 weeks

## 2016-08-10 NOTE — Progress Notes (Signed)
   Subjective:    Patient ID: Penny Carpenter, female    DOB: [birth year], 55 y.o.   MRN: [id number]  HPI  Hypertension- Penny Carpenter is here recheck blood pressure. Last appointment her blood pressure was elevated. She has increased her Diovan 80 mg to 1 whole pill daily. She is only taking the HCTZ as needed. She did not take the HCTZ today. We checked her blood pressure with clinic monitor and blood pressure was 172/91 and with her home monitor 155/100. There is a slight difference in the readings.   Home blood pressure readings - Morning readings - 129/86, 128/89, 113/78, 111/71, 110/84, 124/82, 117/86, 104/72, 118/80, 107/78, 117/85, 116/73, 134/90, 114/79, 113/79, 121/82, 111/78, 120/75, 120/77, 117/85, 127/82, 111/78, 115/82, 114/79, 107/69. Evening readings - 128/81, 112/87, 105/71, 114/80, 106/77, 119/79, 113/74, 112/75, 128/86, 127/83, 120/79, 106/75, 163/101, 123/78.  Review of Systems     Objective:   Physical Exam        Assessment & Plan:  Hypertension - Patient advised to continue to take the whole tablet of Diovan daily and to take the HCTZ daily. She was advised to return in 1 week for a follow appointment with her PCP, Dr Lyn HollingsheadAlexander.

## 2016-08-17 ENCOUNTER — Encounter: Payer: Self-pay | Admitting: Osteopathic Medicine

## 2016-08-17 ENCOUNTER — Ambulatory Visit (INDEPENDENT_AMBULATORY_CARE_PROVIDER_SITE_OTHER): Payer: 59 | Admitting: Osteopathic Medicine

## 2016-08-17 VITALS — BP 158/97 | HR 60 | Ht 61.0 in | Wt 134.0 lb

## 2016-08-17 DIAGNOSIS — I10 Essential (primary) hypertension: Secondary | ICD-10-CM | POA: Diagnosis not present

## 2016-08-17 NOTE — Progress Notes (Signed)
HPI: Penny Carpenter is a 55 y.o. female  who presents to [facility] [place] today, 08/17/16,  for chief complaint of:  Chief Complaint  Patient presents with  . Follow-up    BLOOD PRESSURE    Home cuff 157/103, 160/100 on manual cuff as measured by myself, after 5 mis rest, feet on floor, resting back of chair, arm heart level. Close enough! Pt reports occasional dizziness when working out/heavy exertion. Home BP readings 110s-120s systolic on average.    Past medical, surgical, social and family history reviewed: Past Medical History:  Diagnosis Date  . Essential hypertension 02/08/2014  . Hypertension    Past Surgical History:  Procedure Laterality Date  . ABDOMINAL HYSTERECTOMY  1996   partial  . gallbladder removed  2010   Social History  Substance Use Topics  . Smoking status: Never Smoker  . Smokeless tobacco: Not on file  . Alcohol use 1.0 oz/week    2 drink(s) per week   Family History  Problem Relation Age of Onset  . Heart attack      grandparent  . Diabetes    . Hypertension      parents   . Hyperlipidemia       Current medication list and allergy/intolerance information reviewed:   Current Outpatient Prescriptions on File Prior to Visit  Medication Sig Dispense Refill  . fluticasone (FLONASE) 50 MCG/ACT nasal spray Place 1 spray into the nose.    . hydrochlorothiazide (HYDRODIURIL) 25 MG tablet Take 1 tablet (25 mg total) by mouth daily. 30 tablet 0  . Melatonin 3 MG TABS One by mouth at bedtime.  0  . montelukast (SINGULAIR) 10 MG tablet Take 1 tablet (10 mg total) by mouth at bedtime. 90 tablet 3  . Multiple Vitamins-Minerals (MULTIVITAMIN PO) Take by mouth.    . valsartan (DIOVAN) 80 MG tablet Take 1 tablet (80 mg total) by mouth daily. 30 tablet 1  . VITAMIN D, CHOLECALCIFEROL, PO Take by mouth.     No current facility-administered medications on file prior to visit.    Allergies  Allergen Reactions  . Codeine    shaking  . Morphine And Related Itching    shaking      Review of Systems:  Constitutional: No recent illness  HEENT: No  headache, no vision change  Cardiac: No  chest pain, No  pressure, No palpitations   Respiratory:  No  shortness of breath.   Neurologic: No  weakness, +occasional Dizziness   Exam:  BP (!) 158/97   Pulse 60   Ht 5\' 1"  (1.549 m)   Wt 134 lb (60.8 kg)   BMI 25.32 kg/m   Constitutional: VS see above. General Appearance: alert, well-developed, well-nourished, NAD  Neck: No masses, trachea midline.   Respiratory: Normal respiratory effort. no wheeze, no rhonchi, no rales  Cardiovascular: S1/S2 normal, no murmur, no rub/gallop auscultated. RRR.   Neurological: Normal balance/coordination. No tremor.  Skin: warm, dry, intact.   Psychiatric: Normal judgment/insight. Normal mood and affect. Oriented x3.      ASSESSMENT/PLAN:   White coat syndrome with diagnosis of hypertension - Back to HCTZ as needed for edema, Valsartan 80 mg daily, continue home BP monitoring, cuff was verified 08/17/2016    Patient Instructions  For blood pressure:  Diovan 80 mg daily  Goal BP on home monitor will be 140 top number or less, 90 bottom number or less. If either number is consistently higher, restart HCTZ every day.  Visit summary with medication list and pertinent instructions was printed for patient to review. All questions at time of visit were answered - patient instructed to contact office with any additional concerns. ER/RTC precautions were reviewed with the patient. Follow-up plan: Return in about 6 months (around 02/14/2017) for blood pressure followup.

## 2016-08-17 NOTE — Patient Instructions (Addendum)
For blood pressure:  Diovan 80 mg daily  Goal BP on home monitor will be 140 top number or less, 90 bottom number or less. If either number is consistently higher, restart HCTZ every day.

## 2016-11-15 ENCOUNTER — Other Ambulatory Visit: Payer: Self-pay

## 2016-11-15 ENCOUNTER — Encounter: Payer: Self-pay | Admitting: Osteopathic Medicine

## 2016-11-15 MED ORDER — VALSARTAN 80 MG PO TABS: 80.0000 mg | ORAL_TABLET | Freq: Every day | ORAL | 1 refills | Status: DC

## 2017-01-05 ENCOUNTER — Other Ambulatory Visit: Payer: Self-pay | Admitting: Osteopathic Medicine

## 2017-01-08 ENCOUNTER — Other Ambulatory Visit: Payer: Self-pay | Admitting: Osteopathic Medicine

## 2017-01-14 ENCOUNTER — Other Ambulatory Visit: Payer: Self-pay

## 2017-01-14 MED ORDER — HYDROCHLOROTHIAZIDE 25 MG PO TABS: 25.0000 mg | ORAL_TABLET | Freq: Every day | ORAL | 0 refills | Status: DC

## 2017-01-15 ENCOUNTER — Encounter: Payer: Self-pay | Admitting: Osteopathic Medicine

## 2017-01-15 ENCOUNTER — Ambulatory Visit (INDEPENDENT_AMBULATORY_CARE_PROVIDER_SITE_OTHER): Payer: 59 | Admitting: Osteopathic Medicine

## 2017-01-15 VITALS — BP 157/94 | HR 60 | Ht 61.0 in | Wt 137.0 lb

## 2017-01-15 DIAGNOSIS — I10 Essential (primary) hypertension: Secondary | ICD-10-CM | POA: Diagnosis not present

## 2017-01-15 DIAGNOSIS — Z0000 Encounter for general adult medical examination without abnormal findings: Secondary | ICD-10-CM

## 2017-01-15 MED ORDER — VALSARTAN 80 MG PO TABS: 80.0000 mg | ORAL_TABLET | Freq: Every day | ORAL | 3 refills | Status: DC

## 2017-01-15 MED ORDER — HYDROCHLOROTHIAZIDE 25 MG PO TABS: 25.0000 mg | ORAL_TABLET | Freq: Every day | ORAL | 3 refills | Status: DC

## 2017-01-15 MED ORDER — FLUTICASONE PROPIONATE 50 MCG/ACT NA SUSP: 1.0000 | Freq: Every day | NASAL | 11 refills | Status: DC

## 2017-01-15 NOTE — Progress Notes (Signed)
HPI: Penny Carpenter is a 56 y.o. female  who presents to [facility] [place] today, 01/15/17,  for chief complaint of:  Chief Complaint  Patient presents with  . Annual Exam    Feeling well today, no complaints.   BP home readings consistently <120/80 with occasional increases but usually in the 110s/70s or 80s. No CP/SOB.   Past medical, surgical, social and family history reviewed: Patient Active Problem List   Diagnosis Date Noted  . White coat syndrome with diagnosis of hypertension 08/17/2016  . Environmental allergies 08/04/2014  . Left breast mass 06/15/2014  . Annual physical exam 02/17/2014  . Essential hypertension 02/08/2014  . Vitamin D deficiency 02/08/2014   Past Surgical History:  Procedure Laterality Date  . ABDOMINAL HYSTERECTOMY  1996   partial  . gallbladder removed  2010   Social History  Substance Use Topics  . Smoking status: Never Smoker  . Smokeless tobacco: Never Used  . Alcohol use 1.0 oz/week    2 Standard drinks or equivalent per week   Family History  Problem Relation Age of Onset  . Heart attack      grandparent  . Diabetes    . Hypertension      parents   . Hyperlipidemia       Current medication list and allergy/intolerance information reviewed:   Current Outpatient Prescriptions  Medication Sig Dispense Refill  . fluticasone (FLONASE) 50 MCG/ACT nasal spray Place 1 spray into the nose.    . hydrochlorothiazide (HYDRODIURIL) 25 MG tablet Take 1 tablet (25 mg total) by mouth daily. 90 tablet 0  . Melatonin 3 MG TABS One by mouth at bedtime.  0  . montelukast (SINGULAIR) 10 MG tablet Take 1 tablet (10 mg total) by mouth at bedtime. 90 tablet 3  . Multiple Vitamins-Minerals (MULTIVITAMIN PO) Take by mouth.    . valsartan (DIOVAN) 80 MG tablet TAKE 1 TABLET BY MOUTH EVERY DAY 30 tablet 1  . VITAMIN D, CHOLECALCIFEROL, PO Take by mouth.     No current facility-administered medications for this visit.     Allergies  Allergen Reactions  . Codeine     shaking  . Morphine And Related Itching    shaking      Review of Systems:  Constitutional:  No  fever, no chills, No recent illness  HEENT: No  headache, no vision change, no hearing change, No sore throat, No  sinus pressure  Cardiac: No  chest pain, No  pressure, No palpitations  Respiratory:  No  shortness of breath. No  Cough  Gastrointestinal: No  abdominal pain, No  nausea, No  vomiting,  Musculoskeletal: No new myalgia/arthralgia  Skin: No  Rash, No other wounds/concerning lesions  Hem/Onc: No  easy bruising/bleeding  Endocrine: No cold intolerance,  No heat intolerance. No polyuria/polydipsia/polyphagia    Neurologic: No  weakness, No  dizziness,  Psychiatric: No  concerns with depression, No  concerns with anxiety  Exam:  BP (!) 157/94   Pulse 60   Ht  (1.549 m)   Wt 137 lb (62.1 kg)   BMI 25.89 kg/m   Constitutional: VS see above. General Appearance: alert, well-developed, well-nourished, NAD  Eyes: Normal lids and conjunctive, non-icteric sclera  Ears, Nose, Mouth, Throat: MMM, Normal external inspection ears/nares/mouth/lips/gums. TM normal bilaterally. Pharynx/tonsils no erythema, no exudate. Nasal mucosa normal.   Neck: No masses, trachea midline. No thyroid enlargement. No tenderness/mass appreciated. No lymphadenopathy  Respiratory: Normal respiratory effort. no wheeze,  no rhonchi, no rales  Cardiovascular: S1/S2 normal, no murmur, no rub/gallop auscultated. RRR. No lower extremity edema. Pedal pulse II/IV bilaterally DP and PT. No carotid bruit or JVD. No abdominal aortic bruit.  Gastrointestinal: Nontender, no masses. No hepatomegaly, no splenomegaly. No hernia appreciated. Bowel sounds normal. Rectal exam deferred.   Musculoskeletal: Gait normal. No clubbing/cyanosis of digits.   Neurological: Normal balance/coordination. No tremor. No cranial nerve deficit on limited exam. Motor and  sensation intact and symmetric. Cerebellar reflexes intact.   Skin: warm, dry, intact. No rash/ulcer. No concerning nevi or subq nodules on limited exam.    Psychiatric: Normal judgment/insight. Normal mood and affect. Oriented x3.   Labs reviewed from 6 mos ago - no concerns, good cholesterol, no DM2  ASSESSMENT/PLAN:   Annual physical exam  White coat syndrome with diagnosis of hypertension  Essential hypertension   FEMALE PREVENTIVE CARE Updated 01/15/17   ANNUAL SCREENING/COUNSELING  Diet/Exercise - HEALTHY HABITS DISCUSSED TO DECREASE CV RISK History  Smoking Status  . Never Smoker  Smokeless Tobacco  . Never Used   History  Alcohol Use  . 1.0 oz/week  . 2 Standard drinks or equivalent per week   Depression screen [facility] 2/9 01/15/2017  Decreased Interest 0  Down, Depressed, Hopeless 0  PHQ - 2 Score 0    Domestic violence concerns - no  HTN SCREENING - SEE VITALS  SEXUAL HEALTH  Sexually active in the past year - Yes with female.  Need/want STI testing today? - no  Concerns about libido or pain with sex? - no  INFECTIOUS DISEASE SCREENING  HIV - does not need  GC/CT - does not need  HepC - DOB [birth year] - does not need  TB - does not need  DISEASE SCREENING  Lipid - does not need  DM2 - does not need  Osteoporosis - women age 22+ - does not need  CANCER SCREENING  Cervical - does not need  Breast - does not need  Lung - does not need  Colon - does not need - due in 2024  ADULT VACCINATION  Influenza - annual vaccine recommended  Td - booster every 10 years   Zoster - option at 50, yes at 60+   PCV13 - was not indicated  PPSV23 - was not indicated Immunization History  Administered Date(s) Administered  . Influenza,inj,Quad PF,36+ Mos 07/06/2016  . Influenza-Unspecified 07/11/2015  . Tdap 04/03/2011   Patient Instructions  If you'd like to get blood drawn prior to next visit, please call us a week before your appointment  to ensure the orders are in place at the lab    Visit summary with medication list and pertinent instructions was printed for patient to review. All questions at time of visit were answered - patient instructed to contact office with any additional concerns. ER/RTC precautions were reviewed with the patient. Follow-up plan: Return in about 6 months (around 07/17/2017) for blood pressure follow-up and routine labs - sooner if needed.

## 2017-01-15 NOTE — Patient Instructions (Signed)
If you'd like to get blood drawn prior to next visit, please call us a week before your appointment to ensure the orders are in place at the lab

## 2017-02-08 ENCOUNTER — Encounter: Payer: Self-pay | Admitting: Osteopathic Medicine

## 2017-02-14 ENCOUNTER — Ambulatory Visit: Payer: 59 | Admitting: Osteopathic Medicine

## 2017-05-14 ENCOUNTER — Telehealth: Payer: Self-pay

## 2017-05-14 MED ORDER — LOSARTAN POTASSIUM 50 MG PO TABS: 50.0000 mg | ORAL_TABLET | Freq: Every day | ORAL | 3 refills | Status: DC

## 2017-05-14 NOTE — Telephone Encounter (Signed)
New prescription sent

## 2017-05-14 NOTE — Telephone Encounter (Signed)
[facility] called requesting for valsartan to be changed to a different med. Please advise. -EH/RMA

## 2017-05-15 NOTE — Telephone Encounter (Signed)
Patient notified

## 2017-06-25 ENCOUNTER — Other Ambulatory Visit: Payer: Self-pay | Admitting: Osteopathic Medicine

## 2017-06-25 DIAGNOSIS — Z1239 Encounter for other screening for malignant neoplasm of breast: Secondary | ICD-10-CM

## 2017-07-17 ENCOUNTER — Ambulatory Visit: Payer: 59 | Admitting: Osteopathic Medicine

## 2017-07-18 ENCOUNTER — Encounter: Payer: Self-pay | Admitting: Osteopathic Medicine

## 2017-07-18 ENCOUNTER — Ambulatory Visit (INDEPENDENT_AMBULATORY_CARE_PROVIDER_SITE_OTHER): Payer: 59 | Admitting: Osteopathic Medicine

## 2017-07-18 VITALS — BP 139/84 | HR 71 | Wt 142.0 lb

## 2017-07-18 DIAGNOSIS — J309 Allergic rhinitis, unspecified: Secondary | ICD-10-CM | POA: Insufficient documentation

## 2017-07-18 DIAGNOSIS — Z9109 Other allergy status, other than to drugs and biological substances: Secondary | ICD-10-CM

## 2017-07-18 DIAGNOSIS — R7301 Impaired fasting glucose: Secondary | ICD-10-CM

## 2017-07-18 DIAGNOSIS — I10 Essential (primary) hypertension: Secondary | ICD-10-CM | POA: Diagnosis not present

## 2017-07-18 DIAGNOSIS — Z23 Encounter for immunization: Secondary | ICD-10-CM | POA: Diagnosis not present

## 2017-07-18 MED ORDER — FLUTICASONE PROPIONATE 50 MCG/ACT NA SUSP: 1.0000 | Freq: Every day | NASAL | 3 refills | Status: DC

## 2017-07-18 MED ORDER — MONTELUKAST SODIUM 10 MG PO TABS: 10.0000 mg | ORAL_TABLET | Freq: Every day | ORAL | 3 refills | Status: DC

## 2017-07-18 NOTE — Patient Instructions (Addendum)
Goal BP 130/80 or less

## 2017-07-18 NOTE — Progress Notes (Signed)
HPI: Penny Carpenter is a 56 y.o. female  who presents to [facility] [place] today, 07/18/17,  for chief complaint of:  Chief Complaint  Patient presents with  . Follow-up  .   HTN: doing well on current medications. No CP/SOB.   Seasonal allergies: Flonase and Singulair working well.     Past medical history, surgical history, social history and family history reviewed.  Patient Active Problem List   Diagnosis Date Noted  . White coat syndrome with diagnosis of hypertension 08/17/2016  . Environmental allergies 08/04/2014  . Left breast mass 06/15/2014  . Annual physical exam 02/17/2014  . Essential hypertension 02/08/2014  . Vitamin D deficiency 02/08/2014    Current medication list and allergy/intolerance information reviewed.   Current Outpatient Prescriptions on File Prior to Visit  Medication Sig Dispense Refill  . fluticasone (FLONASE) 50 MCG/ACT nasal spray Place 1-2 sprays into both nostrils daily. 16 g 11  . hydrochlorothiazide (HYDRODIURIL) 25 MG tablet Take 1 tablet (25 mg total) by mouth daily. 90 tablet 3  . losartan (COZAAR) 50 MG tablet Take 1 tablet (50 mg total) by mouth daily. 90 tablet 3  . Melatonin 3 MG TABS One by mouth at bedtime.  0  . montelukast (SINGULAIR) 10 MG tablet Take 1 tablet (10 mg total) by mouth at bedtime. 90 tablet 3  . Multiple Vitamins-Minerals (MULTIVITAMIN PO) Take by mouth.    Marland Kitchen VITAMIN D, CHOLECALCIFEROL, PO Take by mouth.     No current facility-administered medications on file prior to visit.    Allergies  Allergen Reactions  . Codeine     shaking  . Morphine And Related Itching    shaking      Review of Systems:  Constitutional: No recent illness  HEENT: +ocasional mild headache, no vision change  Cardiac: No  chest pain, No  pressure, No palpitations  Respiratory:  No  shortness of breath. No  Cough  Neurologic: No  weakness, No  Dizziness  Psychiatric: No  concerns with  depression, No  concerns with anxiety  Exam:  BP 139/84   Pulse 71   Wt 142 lb (64.4 kg)   BMI 26.83 kg/m  home monitor 136/93  Constitutional: VS see above. General Appearance: alert, well-developed, well-nourished, NAD  Eyes: Normal lids and conjunctive, non-icteric sclera  Ears, Nose, Mouth, Throat: MMM, Normal external inspection ears/nares/mouth/lips/gums.  Neck: No masses, trachea midline.   Respiratory: Normal respiratory effort. no wheeze, no rhonchi, no rales  Cardiovascular: S1/S2 normal, no murmur, no rub/gallop auscultated. RRR.   Musculoskeletal: Gait normal. Symmetric and independent movement of all extremities  Neurological: Normal balance/coordination. No tremor.  Skin: warm, dry, intact.   Psychiatric: Normal judgment/insight. Normal mood and affect. Oriented x3.      ASSESSMENT/PLAN:   White coat syndrome with diagnosis of hypertension - labs about a year ago ok, due for repeat - Plan: CBC, COMPLETE METABOLIC PANEL WITH GFR, Lipid panel, TSH  Need for influenza vaccination - Plan: Flu Vaccine QUAD 6+ mos PF IM (Fluarix Quad PF)  Environmental allergies - Plan: montelukast (SINGULAIR) 10 MG tablet    Patient Instructions  Goal BP 130/80 or less.      Follow-up plan: Return in about 6 months (around 01/16/2018) for [employer] .  Visit summary with medication list and pertinent instructions was printed for patient to review, alert [place] if any changes needed. All questions at time of visit were answered - patient instructed to contact office with  any additional concerns. ER/RTC precautions were reviewed with the patient and understanding verbalized.   Note: Total time spent 15 minutes, greater than 50% of the visit was spent face-to-face counseling and coordinating care for the following: The primary encounter diagnosis was White coat syndrome with diagnosis of hypertension. Diagnoses of Need for influenza vaccination and Environmental allergies were  also pertinent to this visit.Marland Kitchen

## 2017-07-18 NOTE — Addendum Note (Signed)
Addended by: Deirdre Pippins on: 07/18/2017 11:18 PM   Modules accepted: Orders

## 2017-07-20 LAB — LIPID PANEL
CHOL/HDL RATIO: 2.7 (calc) (ref <5.0)
Cholesterol: 182 mg/dL (ref <200)
HDL: 67 mg/dL (ref >50)
LDL CHOLESTEROL (CALC): 96 mg/dL
Non-HDL Cholesterol (Calc): 115 mg/dL (calc) (ref <130)
Triglycerides: 101 mg/dL (ref <150)

## 2017-07-20 LAB — COMPLETE METABOLIC PANEL WITH GFR
AG Ratio: 1.5 (calc) (ref 1.0–2.5)
ALBUMIN MSPROF: 4.3 g/dL (ref 3.6–5.1)
ALT: 20 U/L (ref 6–29)
AST: 21 U/L (ref 10–35)
Alkaline phosphatase (APISO): 89 U/L (ref 33–130)
BILIRUBIN TOTAL: 0.6 mg/dL (ref 0.2–1.2)
BUN: 12 mg/dL (ref 7–25)
CHLORIDE: 101 mmol/L (ref 98–110)
CO2: 30 mmol/L (ref 20–32)
CREATININE: 0.55 mg/dL (ref 0.50–1.05)
Calcium: 9.6 mg/dL (ref 8.6–10.4)
GFR, EST AFRICAN AMERICAN: 122 mL/min/{1.73_m2} (ref >=60)
GFR, Est Non African American: 106 mL/min/{1.73_m2} (ref >=60)
GLUCOSE: 101 mg/dL — AB (ref 65–99)
Globulin: 2.9 g/dL (calc) (ref 1.9–3.7)
Potassium: 3.7 mmol/L (ref 3.5–5.3)
Sodium: 140 mmol/L (ref 135–146)
TOTAL PROTEIN: 7.2 g/dL (ref 6.1–8.1)

## 2017-07-20 LAB — CBC
HEMATOCRIT: 39.6 % (ref 35.0–45.0)
HEMOGLOBIN: 13.5 g/dL (ref 11.7–15.5)
MCH: 29.1 pg (ref 27.0–33.0)
MCHC: 34.1 g/dL (ref 32.0–36.0)
MCV: 85.3 fL (ref 80.0–100.0)
MPV: 10.1 fL (ref 7.5–12.5)
Platelets: 334 10*3/uL (ref 140–400)
RBC: 4.64 10*6/uL (ref 3.80–5.10)
RDW: 12.8 % (ref 11.0–15.0)
WBC: 7.2 10*3/uL (ref 3.8–10.8)

## 2017-07-20 LAB — TEST AUTHORIZATION

## 2017-07-20 LAB — TSH: TSH: 3.98 m[IU]/L

## 2017-07-20 LAB — HEMOGLOBIN A1C W/OUT EAG: Hgb A1c MFr Bld: 5.5 % of total Hgb (ref <5.7)

## 2017-07-26 ENCOUNTER — Ambulatory Visit (INDEPENDENT_AMBULATORY_CARE_PROVIDER_SITE_OTHER): Payer: 59

## 2017-07-26 DIAGNOSIS — Z1239 Encounter for other screening for malignant neoplasm of breast: Secondary | ICD-10-CM

## 2017-07-26 DIAGNOSIS — Z1231 Encounter for screening mammogram for malignant neoplasm of breast: Secondary | ICD-10-CM | POA: Diagnosis not present

## 2017-11-27 ENCOUNTER — Encounter: Payer: Self-pay | Admitting: Osteopathic Medicine

## 2017-11-27 DIAGNOSIS — Z9109 Other allergy status, other than to drugs and biological substances: Secondary | ICD-10-CM

## 2017-11-27 MED ORDER — FLUTICASONE PROPIONATE 50 MCG/ACT NA SUSP: 1.0000 | Freq: Every day | NASAL | 3 refills | Status: DC

## 2017-11-27 MED ORDER — HYDROCHLOROTHIAZIDE 25 MG PO TABS: 25.0000 mg | ORAL_TABLET | Freq: Every day | ORAL | 3 refills | Status: DC

## 2017-11-27 MED ORDER — MONTELUKAST SODIUM 10 MG PO TABS: 10.0000 mg | ORAL_TABLET | Freq: Every day | ORAL | 3 refills | Status: DC

## 2018-01-16 ENCOUNTER — Encounter: Payer: 59 | Admitting: Osteopathic Medicine

## 2018-01-21 ENCOUNTER — Encounter: Payer: Self-pay | Admitting: Osteopathic Medicine

## 2018-01-21 ENCOUNTER — Ambulatory Visit (INDEPENDENT_AMBULATORY_CARE_PROVIDER_SITE_OTHER): Payer: BLUE CROSS/BLUE SHIELD | Admitting: Osteopathic Medicine

## 2018-01-21 VITALS — BP 135/90 | HR 69 | Temp 97.7°F | Wt 139.1 lb

## 2018-01-21 DIAGNOSIS — I10 Essential (primary) hypertension: Secondary | ICD-10-CM | POA: Diagnosis not present

## 2018-01-21 DIAGNOSIS — Z0000 Encounter for general adult medical examination without abnormal findings: Secondary | ICD-10-CM

## 2018-01-21 MED ORDER — HYDROCHLOROTHIAZIDE 25 MG PO TABS: 25.0000 mg | ORAL_TABLET | Freq: Every day | ORAL | 3 refills | Status: DC

## 2018-01-21 MED ORDER — LOSARTAN POTASSIUM 50 MG PO TABS: 50.0000 mg | ORAL_TABLET | Freq: Every day | ORAL | 3 refills | Status: DC

## 2018-01-21 NOTE — Progress Notes (Signed)
HPI: Penny Carpenter is a 57 y.o. female  who presents to [facility] [place] today, 01/21/18,  for chief complaint of:  Annual check-up   Feeling well today, no complaints.   BP home readings consistently <120/80 with occasional increases but usually in the 110s/70s or 80s. No CP/SOB. Has machine with her and verified today   A1C 6 mos ago 5.5% for fasting Glc 101 Lipid check 6 mos ago was also good, LDL 96, HDL 67   Past medical, surgical, social and family history reviewed: Patient Active Problem List   Diagnosis Date Noted  . Allergic rhinitis 07/18/2017  . White coat syndrome with diagnosis of hypertension 08/17/2016  . Environmental allergies 08/04/2014  . Left breast mass 06/15/2014  . Annual physical exam 02/17/2014  . Essential hypertension 02/08/2014  . Vitamin D deficiency 02/08/2014   Past Surgical History:  Procedure Laterality Date  . ABDOMINAL HYSTERECTOMY  1996   partial  . gallbladder removed  2010     Social History   Tobacco Use  . Smoking status: Never Smoker  . Smokeless tobacco: Never Used  Substance Use Topics  . Alcohol use: Yes    Alcohol/week: 1.0 oz    Types: 2 Standard drinks or equivalent per week   Family History  Problem Relation Age of Onset  . Heart attack Unknown        grandparent  . Diabetes Unknown   . Hypertension Unknown        parents   . Hyperlipidemia Unknown       Current medication list and allergy/intolerance information reviewed:   Current Outpatient Medications  Medication Sig Dispense Refill  . fluticasone (FLONASE) 50 MCG/ACT nasal spray Place 1-2 sprays into both nostrils daily. 48 g 3  . hydrochlorothiazide (HYDRODIURIL) 25 MG tablet Take 1 tablet (25 mg total) by mouth daily. 90 tablet 3  . losartan (COZAAR) 50 MG tablet Take 1 tablet (50 mg total) by mouth daily. 90 tablet 3  . Melatonin 3 MG TABS One by mouth at bedtime.  0  . montelukast (SINGULAIR) 10 MG tablet Take 1 tablet  (10 mg total) by mouth at bedtime. 90 tablet 3  . Multiple Vitamins-Minerals (MULTIVITAMIN PO) Take by mouth.    Marland Kitchen. VITAMIN D, CHOLECALCIFEROL, PO Take by mouth.     No current facility-administered medications for this visit.    Allergies  Allergen Reactions  . Codeine     shaking  . Morphine And Related Itching    shaking      Review of Systems:  Constitutional:  No  fever, no chills, No recent illness  HEENT: No  headache, no vision change, no hearing change, No sore throat, No  sinus pressure  Cardiac: No  chest pain, No  pressure, No palpitations  Respiratory:  No  shortness of breath. No  Cough  Gastrointestinal: No  abdominal pain, No  nausea, No  vomiting,  Musculoskeletal: No new myalgia/arthralgia  Skin: No  Rash, No other wounds/concerning lesions  Hem/Onc: No  easy bruising/bleeding  Endocrine: No cold intolerance,  No heat intolerance. No polyuria/polydipsia/polyphagia    Neurologic: No  weakness, No  dizziness,  Psychiatric: No  concerns with depression, No  concerns with anxiety  Exam:  BP 135/90   Pulse 69   Temp 97.7 F (36.5 C) (Oral)   Wt 139 lb 1.9 oz (63.1 kg)   BMI 26.29 kg/m  Home cuff 125/90  Constitutional: VS see above. General Appearance: alert,  well-developed, well-nourished, NAD  Eyes: Normal lids and conjunctive, non-icteric sclera, EOMI, PERRL  Ears, Nose, Mouth, Throat: MMM, Normal external inspection ears/nares/mouth/lips/gums. TM normal bilaterally. Pharynx/tonsils no erythema, no exudate. Nasal mucosa normal.   Neck: No masses, trachea midline. No thyroid enlargement. No tenderness/mass appreciated. No lymphadenopathy  Respiratory: Normal respiratory effort. no wheeze, no rhonchi, no rales  Cardiovascular: S1/S2 normal, no murmur, no rub/gallop auscultated. RRR. No lower extremity edema. Pedal pulse II/IV bilaterally DP and PT.  Gastrointestinal: Nontender, no masses. No hepatomegaly, no splenomegaly. No hernia appreciated.  Bowel sounds normal. Rectal exam deferred.   Musculoskeletal: Gait normal. No clubbing/cyanosis of digits.   Neurological: Normal balance/coordination. No tremor. No cranial nerve deficit on limited exam. Motor and sensation intact and symmetric. Cerebellar reflexes intact.   Skin: warm, dry, intact. No rash/ulcer. No concerning nevi or subq nodules on limited exam.    Psychiatric: Normal judgment/insight. Normal mood and affect. Oriented x3.   Labs reviewed from 6 mos ago - no concerns, good cholesterol, no DM2  ASSESSMENT/PLAN:   Annual physical exam  White coat syndrome with diagnosis of hypertension   FEMALE PREVENTIVE CARE Updated 01/21/18   ANNUAL SCREENING/COUNSELING  Diet/Exercise - HEALTHY HABITS DISCUSSED TO DECREASE CV RISK Social History   Tobacco Use  Smoking Status Never Smoker  Smokeless Tobacco Never Used   Social History   Substance and Sexual Activity  Alcohol Use Yes  . Alcohol/week: 1.0 oz  . Types: 2 Standard drinks or equivalent per week   Depression screen [facility] 2/9 07/18/2017  Decreased Interest 0  Down, Depressed, Hopeless 0  PHQ - 2 Score 0  Altered sleeping 0  Tired, decreased energy 0  Change in appetite 0  Feeling bad or failure about yourself  0  Trouble concentrating 0  Moving slowly or fidgety/restless 0  Suicidal thoughts 0  PHQ-9 Score 0    Domestic violence concerns - no  HTN SCREENING - SEE VITALS  SEXUAL HEALTH  Sexually active in the past year - Yes with female.  Need/want STI testing today? - no  Concerns about libido or pain with sex? - no  INFECTIOUS DISEASE SCREENING  HIV - does not need, has had already   GC/CT - does not need  HepC - DOB [birth year] - does not need, has had already   TB - does not need  DISEASE SCREENING  Lipid - does not need  DM2 - does not need  Osteoporosis - women age 48+ - does not need  CANCER SCREENING  Cervical - does not need - s/p hysterectomy for benign disease    Breast - does not need - done 07/2017  Lung - does not need  Colon - does not need - due in 2024  ADULT VACCINATION  Influenza - annual vaccine recommended  Td - booster every 10 years recommended   Zoster - option at 50, recommended - she will think about this.   PCV13 - was not indicated  PPSV23 - was not indicated Immunization History  Administered Date(s) Administered  . Influenza,inj,Quad PF,6+ Mos 07/06/2016, 07/18/2017  . Influenza-Unspecified 10/15/2009, 07/30/2012, 07/15/2013, 07/11/2015  . Td 10/15/2005  . Tdap 04/03/2011     Visit summary with medication list and pertinent instructions was printed for patient to review. All questions at time of visit were answered - patient instructed to contact office with any additional concerns. ER/RTC precautions were reviewed with the patient. Follow-up plan: Return in about 6 months (around 07/23/2018) for follow up blood pressure  and fasting labs .

## 2018-01-27 ENCOUNTER — Encounter: Payer: Self-pay | Admitting: Osteopathic Medicine

## 2018-05-05 ENCOUNTER — Ambulatory Visit (INDEPENDENT_AMBULATORY_CARE_PROVIDER_SITE_OTHER): Payer: BLUE CROSS/BLUE SHIELD | Admitting: Osteopathic Medicine

## 2018-05-05 VITALS — BP 133/86 | HR 64 | Temp 98.0°F

## 2018-05-05 DIAGNOSIS — Z23 Encounter for immunization: Secondary | ICD-10-CM

## 2018-05-05 DIAGNOSIS — Z9109 Other allergy status, other than to drugs and biological substances: Secondary | ICD-10-CM

## 2018-05-05 LAB — HEMOGLOBIN A1C: Hemoglobin A1C: 5.4

## 2018-05-05 LAB — LIPID PANEL
HDL: 49 (ref 35–70)
LDL Cholesterol: 121
LDL/HDL RATIO: 3.9
Triglycerides: 114 (ref 40–160)

## 2018-05-05 LAB — BASIC METABOLIC PANEL: Glucose: 97

## 2018-05-05 MED ORDER — MONTELUKAST SODIUM 10 MG PO TABS: 10.0000 mg | ORAL_TABLET | Freq: Every day | ORAL | 3 refills | Status: DC

## 2018-05-05 NOTE — Progress Notes (Signed)
Pt came into clinic today for first Shingrix vaccine. Went over possible side effects in great detail. Pt tolerated injection in right deltoid well, no immediate complications. Advised to schedule her next one in 2 months. Verbalized understanding.

## 2018-05-06 ENCOUNTER — Encounter: Payer: Self-pay | Admitting: Osteopathic Medicine

## 2018-05-15 ENCOUNTER — Ambulatory Visit: Payer: BLUE CROSS/BLUE SHIELD

## 2018-05-15 DIAGNOSIS — H524 Presbyopia: Secondary | ICD-10-CM | POA: Diagnosis not present

## 2018-05-15 DIAGNOSIS — Z135 Encounter for screening for eye and ear disorders: Secondary | ICD-10-CM | POA: Diagnosis not present

## 2018-05-23 ENCOUNTER — Other Ambulatory Visit: Payer: Self-pay | Admitting: Osteopathic Medicine

## 2018-06-12 ENCOUNTER — Other Ambulatory Visit: Payer: Self-pay | Admitting: Osteopathic Medicine

## 2018-06-12 DIAGNOSIS — Z1239 Encounter for other screening for malignant neoplasm of breast: Secondary | ICD-10-CM

## 2018-07-20 ENCOUNTER — Other Ambulatory Visit: Payer: Self-pay | Admitting: Osteopathic Medicine

## 2018-07-22 ENCOUNTER — Ambulatory Visit: Payer: BLUE CROSS/BLUE SHIELD | Admitting: Osteopathic Medicine

## 2018-07-23 ENCOUNTER — Other Ambulatory Visit: Payer: Self-pay | Admitting: Osteopathic Medicine

## 2018-07-23 ENCOUNTER — Ambulatory Visit: Payer: BLUE CROSS/BLUE SHIELD

## 2018-07-24 ENCOUNTER — Ambulatory Visit: Payer: BLUE CROSS/BLUE SHIELD | Admitting: Osteopathic Medicine

## 2018-07-24 ENCOUNTER — Encounter: Payer: Self-pay | Admitting: Osteopathic Medicine

## 2018-07-24 ENCOUNTER — Ambulatory Visit: Payer: BLUE CROSS/BLUE SHIELD

## 2018-07-24 ENCOUNTER — Ambulatory Visit (INDEPENDENT_AMBULATORY_CARE_PROVIDER_SITE_OTHER): Payer: BLUE CROSS/BLUE SHIELD | Admitting: Osteopathic Medicine

## 2018-07-24 ENCOUNTER — Ambulatory Visit (INDEPENDENT_AMBULATORY_CARE_PROVIDER_SITE_OTHER): Payer: BLUE CROSS/BLUE SHIELD

## 2018-07-24 VITALS — BP 137/83 | HR 62 | Temp 98.0°F | Wt 135.3 lb

## 2018-07-24 DIAGNOSIS — Z1231 Encounter for screening mammogram for malignant neoplasm of breast: Secondary | ICD-10-CM

## 2018-07-24 DIAGNOSIS — I10 Essential (primary) hypertension: Secondary | ICD-10-CM

## 2018-07-24 DIAGNOSIS — Z1239 Encounter for other screening for malignant neoplasm of breast: Secondary | ICD-10-CM

## 2018-07-24 NOTE — Progress Notes (Signed)
HPI: Penny Carpenter is a 57 y.o. female who  has a past medical history of Essential hypertension (02/08/2014) and Hypertension.  she presents to [facility] today, 07/24/18,  for chief complaint of:  BP check and lab review  6 mos BP f/u today No CP/SOB, no HA/VC, no dizziness  2 mos ago labs: LDL 121 A1C 5.4  Glc 97  BP Readings from Last 3 Encounters:  07/24/18 137/83  05/05/18 133/86  01/21/18 135/90      Past medical history, surgical history, and family history reviewed.  Current medication list and allergy/intolerance information reviewed.   (See remainder of HPI, ROS, Phys Exam below)    ASSESSMENT/PLAN:   Blood pressure stable on current medications  Essential hypertension    Follow-up plan: Return in about 6 months (around 01/23/2019) for [employer], SOONER IF NEEDED .                             ############################################ ############################################ ############################################ ############################################    Outpatient Encounter Medications as of 07/24/2018  Medication Sig  . fluticasone (FLONASE) 50 MCG/ACT nasal spray Place 1-2 sprays into both nostrils daily.  . hydrochlorothiazide (HYDRODIURIL) 25 MG tablet Take 1 tablet (25 mg total) by mouth daily.  Marland Kitchen losartan (COZAAR) 50 MG tablet TAKE 1 TABLET BY MOUTH EVERY DAY  . Melatonin 3 MG TABS One by mouth at bedtime.  . montelukast (SINGULAIR) 10 MG tablet Take 1 tablet (10 mg total) by mouth at bedtime.  . Multiple Vitamins-Minerals (MULTIVITAMIN PO) Take by mouth.  Marland Kitchen VITAMIN D, CHOLECALCIFEROL, PO Take by mouth.   No facility-administered encounter medications on file as of 07/24/2018.    Allergies  Allergen Reactions  . Codeine     shaking  . Morphine And Related Itching    shaking  . Oxycodone-Acetaminophen Other (See Comments)    Unknown      Review of  Systems:  Constitutional: No recent illness  HEENT: No  headache, no vision change  Cardiac: No  chest pain, No  pressure, No palpitations  Respiratory:  No  shortness of breath. No  Cough   Exam:  BP 137/83 (BP Location: Left Arm, Patient Position: Sitting, Cuff Size: Normal)   Pulse 62   Temp 98 F (36.7 C) (Oral)   Wt 135 lb 4.8 oz (61.4 kg)   BMI 25.56 kg/m   Constitutional: VS see above. General Appearance: alert, well-developed, well-nourished, NAD  Eyes: Normal lids and conjunctive, non-icteric sclera  Ears, Nose, Mouth, Throat: MMM, Normal external inspection ears/nares/mouth/lips/gums.  Neck: No masses, trachea midline.   Respiratory: Normal respiratory effort. no wheeze, no rhonchi, no rales  Cardiovascular: S1/S2 normal, no murmur, no rub/gallop auscultated. RRR.   Musculoskeletal: Gait normal. Symmetric and independent movement of all extremities  Neurological: Normal balance/coordination. No tremor.  Skin: warm, dry, intact.   Psychiatric: Normal judgment/insight. Normal mood and affect. Oriented x3.   Visit summary with medication list and pertinent instructions was printed for patient to review, advised to alert [place] if any changes needed. All questions at time of visit were answered - patient instructed to contact office with any additional concerns. ER/RTC precautions were reviewed with the patient and understanding verbalized.   Follow-up plan: Return in about 6 months (around 01/23/2019) for [employer], SOONER IF NEEDED .   Please note: voice recognition software was used to produce this document, and typos may escape review. Please contact  Dr. Sheppard Coil for any needed clarifications.

## 2018-07-25 ENCOUNTER — Encounter: Payer: Self-pay | Admitting: Osteopathic Medicine

## 2018-08-08 ENCOUNTER — Ambulatory Visit (INDEPENDENT_AMBULATORY_CARE_PROVIDER_SITE_OTHER): Payer: BLUE CROSS/BLUE SHIELD | Admitting: Physician Assistant

## 2018-08-08 VITALS — BP 138/77 | HR 76 | Temp 98.1°F

## 2018-08-08 DIAGNOSIS — Z23 Encounter for immunization: Secondary | ICD-10-CM | POA: Diagnosis not present

## 2018-08-08 DIAGNOSIS — I10 Essential (primary) hypertension: Secondary | ICD-10-CM

## 2018-08-08 MED ORDER — HYDROCHLOROTHIAZIDE 25 MG PO TABS: 25.0000 mg | ORAL_TABLET | Freq: Every day | ORAL | 3 refills | Status: DC

## 2018-08-08 MED ORDER — IBUPROFEN 200 MG PO TABS: 600.0000 mg | ORAL_TABLET | Freq: Once | ORAL | Status: DC

## 2018-08-08 NOTE — Progress Notes (Signed)
Pt in today for 2nd Shingrix.   Vitals:   08/08/18 1559  BP: 138/77  Pulse: 76  Temp: 98.1 F (36.7 C)      Injection given in left deltoid. Pt tolerated injection without complications.

## 2018-08-15 ENCOUNTER — Other Ambulatory Visit: Payer: Self-pay

## 2018-08-15 DIAGNOSIS — Z9109 Other allergy status, other than to drugs and biological substances: Secondary | ICD-10-CM

## 2018-08-15 MED ORDER — MONTELUKAST SODIUM 10 MG PO TABS: 10.0000 mg | ORAL_TABLET | Freq: Every day | ORAL | 3 refills | Status: DC

## 2018-08-15 MED ORDER — LOSARTAN POTASSIUM 50 MG PO TABS: 50.0000 mg | ORAL_TABLET | Freq: Every day | ORAL | 1 refills | Status: DC

## 2018-08-17 ENCOUNTER — Encounter: Payer: Self-pay | Admitting: Physician Assistant

## 2019-01-13 ENCOUNTER — Encounter: Payer: Self-pay | Admitting: Osteopathic Medicine

## 2019-01-22 ENCOUNTER — Encounter: Payer: BLUE CROSS/BLUE SHIELD | Admitting: Osteopathic Medicine

## 2019-02-03 ENCOUNTER — Encounter: Payer: BLUE CROSS/BLUE SHIELD | Admitting: Osteopathic Medicine

## 2019-02-08 ENCOUNTER — Other Ambulatory Visit: Payer: Self-pay | Admitting: Osteopathic Medicine

## 2019-03-05 ENCOUNTER — Encounter: Payer: BLUE CROSS/BLUE SHIELD | Admitting: Osteopathic Medicine

## 2019-03-19 ENCOUNTER — Ambulatory Visit (INDEPENDENT_AMBULATORY_CARE_PROVIDER_SITE_OTHER): Payer: BLUE CROSS/BLUE SHIELD | Admitting: Osteopathic Medicine

## 2019-03-19 ENCOUNTER — Encounter: Payer: Self-pay | Admitting: Osteopathic Medicine

## 2019-03-19 VITALS — BP 151/82 | HR 74 | Temp 98.3°F | Wt 135.1 lb

## 2019-03-19 DIAGNOSIS — R7301 Impaired fasting glucose: Secondary | ICD-10-CM | POA: Diagnosis not present

## 2019-03-19 DIAGNOSIS — Z8349 Family history of other endocrine, nutritional and metabolic diseases: Secondary | ICD-10-CM

## 2019-03-19 DIAGNOSIS — Z9109 Other allergy status, other than to drugs and biological substances: Secondary | ICD-10-CM | POA: Diagnosis not present

## 2019-03-19 DIAGNOSIS — I10 Essential (primary) hypertension: Secondary | ICD-10-CM | POA: Diagnosis not present

## 2019-03-19 DIAGNOSIS — Z0000 Encounter for general adult medical examination without abnormal findings: Secondary | ICD-10-CM

## 2019-03-19 MED ORDER — MONTELUKAST SODIUM 10 MG PO TABS: 10.0000 mg | ORAL_TABLET | Freq: Every day | ORAL | 3 refills | Status: DC

## 2019-03-19 MED ORDER — LOSARTAN POTASSIUM 50 MG PO TABS: 50.0000 mg | ORAL_TABLET | Freq: Every day | ORAL | 3 refills | Status: DC

## 2019-03-19 MED ORDER — FLUTICASONE PROPIONATE 50 MCG/ACT NA SUSP: 1.0000 | Freq: Every day | NASAL | 3 refills | Status: DC

## 2019-03-19 MED ORDER — HYDROCHLOROTHIAZIDE 25 MG PO TABS: 25.0000 mg | ORAL_TABLET | Freq: Every day | ORAL | 3 refills | Status: DC

## 2019-03-19 NOTE — Progress Notes (Signed)
HPI: Penny Carpenter is a 58 y.o. female who  has a past medical history of Essential hypertension (02/08/2014) and Hypertension.  she [facility] today, 03/19/19,  for chief complaint of: Annual physical   Patient here for annual physical / wellness exam.  See preventive care reviewed as below.   Additional concerns today include: None   BP Readings from Last 3 Encounters:  03/19/19 (!) 151/82  08/08/18 138/77  07/24/18 137/83         Past medical, surgical, social and family history reviewed:  Patient Active Problem List   Diagnosis Date Noted  . Allergic rhinitis 07/18/2017  . White coat syndrome with diagnosis of hypertension 08/17/2016  . Environmental allergies 08/04/2014  . Left breast mass 06/15/2014  . Annual physical exam 02/17/2014  . Essential hypertension 02/08/2014  . Vitamin D deficiency 02/08/2014    Past Surgical History:  Procedure Laterality Date  . ABDOMINAL HYSTERECTOMY  1996   fibroids and bleeding   . gallbladder removed  2010    Social History   Tobacco Use  . Smoking status: Never Smoker  . Smokeless tobacco: Never Used  Substance Use Topics  . Alcohol use: Yes    Alcohol/week: 2.0 standard drinks    Types: 2 Standard drinks or equivalent per week    Comment: few glasses of wine per week     Family History  Problem Relation Age of Onset  . Heart attack Unknown        grandparent  . Diabetes Mother   . Hypertension Mother   . Diabetes Father   . Hypertension Father   . Aortic dissection Father   . Kidney failure Father   . Stroke Father        tia/ministrokes, 2     Current medication list and allergy/intolerance information reviewed:    Current Outpatient Medications  Medication Sig Dispense Refill  . fluticasone (FLONASE) 50 MCG/ACT nasal spray Place 1-2 sprays into both nostrils daily. 48 g 3  . hydrochlorothiazide (HYDRODIURIL) 25 MG tablet Take 1 tablet (25 mg total)  by mouth daily. 90 tablet 3  . losartan (COZAAR) 50 MG tablet Take 1 tablet (50 mg total) by mouth daily. 90 tablet 3  . Melatonin 1 MG/4ML LIQD Take by mouth.    . montelukast (SINGULAIR) 10 MG tablet Take 1 tablet (10 mg total) by mouth at bedtime. 90 tablet 3  . Multiple Vitamins-Minerals (MULTIVITAMIN PO) Take by mouth.    Marland Kitchen. VITAMIN D, CHOLECALCIFEROL, PO Take by mouth.     No current facility-administered medications for this visit.     Allergies  Allergen Reactions  . Codeine     shaking  . Morphine And Related Itching    shaking  . Oxycodone-Acetaminophen Other (See Comments)    Unknown      Review of Systems:  Constitutional:  No  fever, no chills, No recent illness, No unintentional weight changes. No significant fatigue.   HEENT: No  headache, no vision change, no hearing change, No sore throat, No  sinus pressure  Cardiac: No  chest pain, No  pressure, No palpitations, No  Orthopnea  Respiratory:  No  shortness of breath. No  Cough  Gastrointestinal: No  abdominal pain, No  nausea, No  vomiting,  No  blood in stool, No  diarrhea, No  constipation   Musculoskeletal: No new myalgia/arthralgia  Skin: No  Rash, No other wounds/concerning lesions  Genitourinary: No  incontinence, No  abnormal genital bleeding, No abnormal genital discharge  Hem/Onc: No  easy bruising/bleeding, No  abnormal lymph node  Endocrine: No cold intolerance,  No heat intolerance. No polyuria/polydipsia/polyphagia   Neurologic: No  weakness, No  dizziness, No  slurred speech/focal weakness/facial droop  Psychiatric: No  concerns with depression, No  concerns with anxiety, No sleep problems, No mood problems  Exam:  BP (!) 151/82 (BP Location: Left Arm, Patient Position: Sitting, Cuff Size: Normal)   Pulse 74   Temp 98.3 F (36.8 C) (Oral)   Wt 135 lb 1.6 oz (61.3 kg)   BMI 25.53 kg/m   Constitutional: VS see above. General Appearance: alert, well-developed, well-nourished, NAD   Eyes: Normal lids and conjunctive, non-icteric sclera  Ears, Nose, Mouth, Throat: MMM, Normal external inspection ears/nares/mouth/lips/gums. TM normal bilaterally. Pharynx/tonsils no erythema, no exudate. Nasal mucosa normal.   Neck: No masses, trachea midline. No thyroid enlargement. No tenderness/mass appreciated. No lymphadenopathy  Respiratory: Normal respiratory effort. no wheeze, no rhonchi, no rales  Cardiovascular: S1/S2 normal, no murmur, no rub/gallop auscultated. RRR. No lower extremity edema. Pedal pulse II/IV bilaterally DP and PT. No carotid bruit or JVD. No abdominal aortic bruit.  Gastrointestinal: Nontender, no masses. No hepatomegaly, no splenomegaly. No hernia appreciated. Bowel sounds normal. Rectal exam deferred.   Musculoskeletal: Gait normal. No clubbing/cyanosis of digits.   Neurological: Normal balance/coordination. No tremor. No cranial nerve deficit on limited exam. Motor and sensation intact and symmetric. Cerebellar reflexes intact.   Skin: warm, dry, intact. No rash/ulcer. No concerning nevi or subq nodules on limited exam.    Psychiatric: Normal judgment/insight. Normal mood and affect. Oriented x3.      ASSESSMENT/PLAN: The primary encounter diagnosis was Annual physical exam. Diagnoses of Environmental allergies, Essential hypertension, and Family history of thyroid disease were also pertinent to this visit.   Orders Placed This Encounter  Procedures  . CBC  . COMPLETE METABOLIC PANEL WITH GFR  . Lipid panel  . TSH    Meds ordered this encounter  Medications  . fluticasone (FLONASE) 50 MCG/ACT nasal spray    Sig: Place 1-2 sprays into both nostrils daily.    Dispense:  48 g    Refill:  3  . hydrochlorothiazide (HYDRODIURIL) 25 MG tablet    Sig: Take 1 tablet (25 mg total) by mouth daily.    Dispense:  90 tablet    Refill:  3  . losartan (COZAAR) 50 MG tablet    Sig: Take 1 tablet (50 mg total) by mouth daily.    Dispense:  90 tablet     Refill:  3    Needs appointment  . montelukast (SINGULAIR) 10 MG tablet    Sig: Take 1 tablet (10 mg total) by mouth at bedtime.    Dispense:  90 tablet    Refill:  3    Patient Instructions  General Preventive Care  Most recent routine screening lipids/other labs: ordered  Tobacco: don't!  Alcohol: responsible moderation is ok for most adults - if you have concerns about your alcohol intake, please talk to me!   Exercise: as tolerated to reduce risk of cardiovascular disease and diabetes. Strength training will also prevent osteoporosis.   Mental health: if need for mental health care (medicines, counseling, other), or concerns about moods, please let me know!   Sexual health: if need for STD testing, or if concerns with libido/pain problems, please let me know!   Advanced Directive: Living Will and/or Healthcare Power of Attorney recommended for all  adults, regardless of age or health.  Vaccines  Flu vaccine: recommended for almost everyone, every fall.   Shingles vaccine: Shingrix done!  Pneumonia vaccines: Prevnar and Pneumovax recommended after age 27, or sooner if certain medical conditions.  Tetanus booster: Tdap recommended every 10 years. Due 03/2021 Cancer screenings   Colon cancer screening: due 2027  Breast cancer screening: mammogram due 07/2019  Cervical cancer screening: Can stop w/ hysterectomy.   Lung cancer screening: not needed for non-smokers  Infection screenings . HIV: recommended screening at least once age 8-65, more often as needed. Done 2017.  Marland Kitchen Gonorrhea/Chlamydia: screening as needed . Hepatitis C: recommended once for anyone born 42-1964. Done 2017 . TB: certain at-risk populations, or depending on work requirements and/or travel history Other . Bone Density Test: recommended for women at age 9        Visit summary with medication list and pertinent instructions was printed for patient to review. All questions at time of visit  were answered - patient instructed to contact office with any additional concerns or updates. ER/RTC precautions were reviewed with the patient.      Please note: voice recognition software was used to produce this document, and typos may escape review. Please contact Dr. Lyn Hollingshead for any needed clarifications.     Follow-up plan: Return in about 1 year (around 03/18/2020) for annual physical, sooner if needed .

## 2019-03-19 NOTE — Patient Instructions (Addendum)
General Preventive Care  Most recent routine screening lipids/other labs: ordered  Tobacco: don't!  Alcohol: responsible moderation is ok for most adults - if you have concerns about your alcohol intake, please talk to me!   Exercise: as tolerated to reduce risk of cardiovascular disease and diabetes. Strength training will also prevent osteoporosis.   Mental health: if need for mental health care (medicines, counseling, other), or concerns about moods, please let me know!   Sexual health: if need for STD testing, or if concerns with libido/pain problems, please let me know!   Advanced Directive: Living Will and/or Healthcare Power of Attorney recommended for all adults, regardless of age or health.  Vaccines  Flu vaccine: recommended for almost everyone, every fall.   Shingles vaccine: Shingrix done!  Pneumonia vaccines: Prevnar and Pneumovax recommended after age 47, or sooner if certain medical conditions.  Tetanus booster: Tdap recommended every 10 years. Due 03/2021 Cancer screenings   Colon cancer screening: due 2027  Breast cancer screening: mammogram due 07/2019  Cervical cancer screening: Can stop w/ hysterectomy.   Lung cancer screening: not needed for non-smokers  Infection screenings . HIV: recommended screening at least once age 53-65, more often as needed. Done 2017.  Marland Kitchen Gonorrhea/Chlamydia: screening as needed . Hepatitis C: recommended once for anyone born 50-1964. Done 2017 . TB: certain at-risk populations, or depending on work requirements and/or travel history Other . Bone Density Test: recommended for women at age 58

## 2019-03-20 LAB — TSH: TSH: 2.57 mIU/L (ref 0.40–4.50)

## 2019-03-21 LAB — COMPLETE METABOLIC PANEL WITH GFR
AG Ratio: 1.6 (calc) (ref 1.0–2.5)
ALT: 18 U/L (ref 6–29)
AST: 18 U/L (ref 10–35)
Albumin: 4.6 g/dL (ref 3.6–5.1)
Alkaline phosphatase (APISO): 86 U/L (ref 37–153)
BUN: 11 mg/dL (ref 7–25)
CO2: 28 mmol/L (ref 20–32)
Calcium: 10 mg/dL (ref 8.6–10.4)
Chloride: 102 mmol/L (ref 98–110)
Creat: 0.71 mg/dL (ref 0.50–1.05)
GFR, Est African American: 110 mL/min/{1.73_m2} (ref >=60)
GFR, Est Non African American: 95 mL/min/{1.73_m2} (ref >=60)
Globulin: 2.9 g/dL (calc) (ref 1.9–3.7)
Glucose, Bld: 103 mg/dL — ABNORMAL HIGH (ref 65–99)
Potassium: 4.2 mmol/L (ref 3.5–5.3)
Sodium: 142 mmol/L (ref 135–146)
Total Bilirubin: 0.6 mg/dL (ref 0.2–1.2)
Total Protein: 7.5 g/dL (ref 6.1–8.1)

## 2019-03-21 LAB — LIPID PANEL
Cholesterol: 178 mg/dL (ref <200)
HDL: 67 mg/dL (ref >=50)
LDL Cholesterol (Calc): 92 mg/dL (calc)
Non-HDL Cholesterol (Calc): 111 mg/dL (calc) (ref <130)
Total CHOL/HDL Ratio: 2.7 (calc) (ref <5.0)
Triglycerides: 101 mg/dL (ref <150)

## 2019-03-21 LAB — CBC
HCT: 41.2 % (ref 35.0–45.0)
Hemoglobin: 13.6 g/dL (ref 11.7–15.5)
MCH: 29.1 pg (ref 27.0–33.0)
MCHC: 33 g/dL (ref 32.0–36.0)
MCV: 88 fL (ref 80.0–100.0)
MPV: 10.5 fL (ref 7.5–12.5)
Platelets: 331 10*3/uL (ref 140–400)
RBC: 4.68 10*6/uL (ref 3.80–5.10)
RDW: 12.7 % (ref 11.0–15.0)
WBC: 6.3 10*3/uL (ref 3.8–10.8)

## 2019-03-21 LAB — HEMOGLOBIN A1C W/OUT EAG: Hgb A1c MFr Bld: 5.5 % of total Hgb (ref <5.7)

## 2019-03-21 LAB — TEST AUTHORIZATION

## 2019-05-21 DIAGNOSIS — H5213 Myopia, bilateral: Secondary | ICD-10-CM | POA: Diagnosis not present

## 2019-05-21 DIAGNOSIS — H524 Presbyopia: Secondary | ICD-10-CM | POA: Diagnosis not present

## 2019-06-04 ENCOUNTER — Other Ambulatory Visit: Payer: Self-pay

## 2019-06-04 ENCOUNTER — Ambulatory Visit (INDEPENDENT_AMBULATORY_CARE_PROVIDER_SITE_OTHER): Payer: BC Managed Care – PPO | Admitting: Family Medicine

## 2019-06-04 ENCOUNTER — Encounter: Payer: Self-pay | Admitting: Family Medicine

## 2019-06-04 VITALS — BP 154/77 | HR 74 | Temp 98.1°F | Wt 138.0 lb

## 2019-06-04 DIAGNOSIS — I10 Essential (primary) hypertension: Secondary | ICD-10-CM | POA: Diagnosis not present

## 2019-06-04 DIAGNOSIS — R300 Dysuria: Secondary | ICD-10-CM

## 2019-06-04 LAB — POCT URINALYSIS DIPSTICK
Bilirubin, UA: NEGATIVE
Glucose, UA: NEGATIVE
Ketones, UA: NEGATIVE
Nitrite, UA: NEGATIVE
Protein, UA: NEGATIVE
Spec Grav, UA: 1.01 (ref 1.010–1.025)
Urobilinogen, UA: 0.2 E.U./dL
pH, UA: 7 (ref 5.0–8.0)

## 2019-06-04 MED ORDER — CEPHALEXIN 500 MG PO CAPS: 500.0000 mg | ORAL_CAPSULE | Freq: Two times a day (BID) | ORAL | 0 refills | Status: DC

## 2019-06-04 NOTE — Progress Notes (Signed)
Penny Carpenter is a 58 y.o. female who presents to [facility]: Penny Carpenter today for urinary pressure and urgency.  Penny Carpenter complains of a 4 day Hx of lower abd pressure and discomfort when she urinates in addition to urinary urgency. Symptoms worsened yesterday. Denies burning, change in urine odor or color, fever, or feeling unwell. She took Azo yesterday without much improvement.  Additionally patient notes that she has a history of whitecoat hypertension.  She notes her blood pressures typically much better controlled at home.  She is addressed this previously with his PCP.  ROS as above:  Exam:  BP (!) 154/77   Pulse 74   Temp 98.1 F (36.7 C) (Oral)   Wt 138 lb (62.6 kg)   BMI 26.07 kg/m  Wt Readings from Last 5 Encounters:  06/04/19 138 lb (62.6 kg)  03/19/19 135 lb 1.6 oz (61.3 kg)  07/24/18 135 lb 4.8 oz (61.4 kg)  01/21/18 139 lb 1.9 oz (63.1 kg)  07/18/17 142 lb (64.4 kg)    Gen: Well NAD HEENT: EOMI,  MMM Lungs: Normal work of breathing. CTABL Heart: RRR no MRG Abd: NABS, Soft. Nondistended, Mildly tender in LLQ and RLQ. No CVA tenderness.  Pain improved with contraction of abdominal wall musculature.  No rebound or guarding.  No masses palpated. Exts: Brisk capillary refill, warm and well perfused.   Lab and Radiology Results Results for orders placed or performed in visit on 06/04/19 (from the past 72 hour(s))  POCT Urinalysis Dipstick     Status: Abnormal   Collection Time: 06/04/19  9:53 AM  Result Value Ref Range   Color, UA yellow    Clarity, UA clear    Glucose, UA Negative Negative   Bilirubin, UA neg    Ketones, UA neg    Spec Grav, UA 1.010 1.010 - 1.025   Blood, UA small    pH, UA 7.0 5.0 - 8.0   Protein, UA Negative Negative   Urobilinogen, UA 0.2 0.2 or 1.0 E.U./dL   Nitrite, UA neg    Leukocytes, UA Small (1+) (A) Negative   Appearance     Odor     No results found.    Assessment and Plan: 58 y.o. female with urinary pressure and urgency.  Penny Carpenter complains of a 4 day Hx of lower abd pressure and discomfort when she urinates in addition to urinary urgency. Symptoms worsened yesterday. Denies burning, change in urine odor or color, fever, or feeling unwell. She took Azo yesterday without much improvement. Symptoms and urinalysis (positive for Leukocytes and Blood) are consistent with UTI.  Prescribed 1-week course of Keflex and urine culture sent.  Blanket hypertension: Reviewed blood pressure log from last visit with PCP June 4.  Blood pressure much better controlled at home.  Watchful waiting.  PDMP not reviewed this encounter. Orders Placed This Encounter  Procedures  . Urine Culture  . POCT Urinalysis Dipstick   Meds ordered this encounter  Medications  . cephALEXin (KEFLEX) 500 MG capsule    Sig: Take 1 capsule (500 mg total) by mouth 2 (two) times daily.    Dispense:  14 capsule    Refill:  0     Historical information moved to improve visibility of documentation.  Past Medical History:  Diagnosis Date  . Essential hypertension 02/08/2014  . Hypertension    Past Surgical History:  Procedure Laterality Date  . ABDOMINAL HYSTERECTOMY  1996   fibroids  and bleeding   . gallbladder removed  2010   Social History   Tobacco Use  . Smoking status: Never Smoker  . Smokeless tobacco: Never Used  Substance Use Topics  . Alcohol use: Yes    Alcohol/week: 2.0 standard drinks    Types: 2 Standard drinks or equivalent per week    Comment: few glasses of wine per week    family history includes Aortic dissection in her father; Diabetes in her father and mother; Heart attack in her unknown relative; Hypertension in her father and mother; Kidney failure in her father; Stroke in her father.  Medications: Current Outpatient Medications  Medication Sig Dispense Refill  . fluticasone (FLONASE)  50 MCG/ACT nasal spray Place 1-2 sprays into both nostrils daily. 48 g 3  . hydrochlorothiazide (HYDRODIURIL) 25 MG tablet Take 1 tablet (25 mg total) by mouth daily. 90 tablet 3  . losartan (COZAAR) 50 MG tablet Take 1 tablet (50 mg total) by mouth daily. 90 tablet 3  . Melatonin 1 MG/4ML LIQD Take by mouth.    . montelukast (SINGULAIR) 10 MG tablet Take 1 tablet (10 mg total) by mouth at bedtime. 90 tablet 3  . Multiple Vitamins-Minerals (MULTIVITAMIN PO) Take by mouth.    Marland Kitchen. VITAMIN D, CHOLECALCIFEROL, PO Take by mouth.    . cephALEXin (KEFLEX) 500 MG capsule Take 1 capsule (500 mg total) by mouth 2 (two) times daily. 14 capsule 0   No current facility-administered medications for this visit.    Allergies  Allergen Reactions  . Codeine     shaking  . Morphine And Related Itching    shaking  . Oxycodone-Acetaminophen Other (See Comments)    Unknown     Discussed warning signs or symptoms. Please see discharge instructions. Patient expresses understanding.  I personally was present and performed or re-performed the history, physical exam and medical decision-making activities of this service and have verified that the service and findings are accurately documented in the student's note. ___________________________________________ Clementeen GrahamEvan Alp Goldwater M.D., ABFM., CAQSM. Primary Care and Sports Medicine Adjunct Instructor of Family Medicine  [employer]

## 2019-06-04 NOTE — Patient Instructions (Signed)
Thank you for coming in today. Take the antibiotic we discussed.  If not improved let me know.  If your belly pain worsens, or you have high fever, bad vomiting, blood in your stool or black tarry stool go to the Emergency Room.    Urinary Tract Infection, Adult A urinary tract infection (UTI) is an infection of any part of the urinary tract. The urinary tract includes:  The kidneys.  The ureters.  The bladder.  The urethra. These organs make, store, and get rid of pee (urine) in the body. What are the causes? This is caused by germs (bacteria) in your genital area. These germs grow and cause swelling (inflammation) of your urinary tract. What increases the risk? You are more likely to develop this condition if:  You have a small, thin tube (catheter) to drain pee.  You cannot control when you pee or poop (incontinence).  You are female, and: ? You use these methods to prevent pregnancy: ? A medicine that kills sperm (spermicide). ? A device that blocks sperm (diaphragm). ? You have low levels of a female hormone (estrogen). ? You are pregnant.  You have genes that add to your risk.  You are sexually active.  You take antibiotic medicines.  You have trouble peeing because of: ? A prostate that is bigger than normal, if you are female. ? A blockage in the part of your body that drains pee from the bladder (urethra). ? A kidney stone. ? A nerve condition that affects your bladder (neurogenic bladder). ? Not getting enough to drink. ? Not peeing often enough.  You have other conditions, such as: ? Diabetes. ? A weak disease-fighting system (immune system). ? Sickle cell disease. ? Gout. ? Injury of the spine. What are the signs or symptoms? Symptoms of this condition include:  Needing to pee right away (urgently).  Peeing often.  Peeing small amounts often.  Pain or burning when peeing.  Blood in the pee.  Pee that smells bad or not like normal.  Trouble  peeing.  Pee that is cloudy.  Fluid coming from the vagina, if you are female.  Pain in the belly or lower back. Other symptoms include:  Throwing up (vomiting).  No urge to eat.  Feeling mixed up (confused).  Being tired and grouchy (irritable).  A fever.  Watery poop (diarrhea). How is this treated? This condition may be treated with:  Antibiotic medicine.  Other medicines.  Drinking enough water. Follow these instructions at home:  Medicines  Take over-the-counter and prescription medicines only as told by your doctor.  If you were prescribed an antibiotic medicine, take it as told by your doctor. Do not stop taking it even if you start to feel better. General instructions  Make sure you: ? Pee until your bladder is empty. ? Do not hold pee for a long time. ? Empty your bladder after sex. ? Wipe from front to back after pooping if you are a female. Use each tissue one time when you wipe.  Drink enough fluid to keep your pee pale yellow.  Keep all follow-up visits as told by your doctor. This is important. Contact a doctor if:  You do not get better after 1-2 days.  Your symptoms go away and then come back. Get help right away if:  You have very bad back pain.  You have very bad pain in your lower belly.  You have a fever.  You are sick to your stomach (nauseous).  You are throwing up. Summary  A urinary tract infection (UTI) is an infection of any part of the urinary tract.  This condition is caused by germs in your genital area.  There are many risk factors for a UTI. These include having a small, thin tube to drain pee and not being able to control when you pee or poop.  Treatment includes antibiotic medicines for germs.  Drink enough fluid to keep your pee pale yellow. This information is not intended to replace advice given to you by your health care provider. Make sure you discuss any questions you have with your health care provider.  Document Released: 03/19/2008 Document Revised: 09/18/2018 Document Reviewed: 04/10/2018 Elsevier Patient Education  2020 [employer].

## 2019-06-06 LAB — URINE CULTURE
MICRO NUMBER:: 793470
SPECIMEN QUALITY:: ADEQUATE

## 2019-07-20 ENCOUNTER — Other Ambulatory Visit: Payer: Self-pay | Admitting: Osteopathic Medicine

## 2019-07-20 DIAGNOSIS — Z1231 Encounter for screening mammogram for malignant neoplasm of breast: Secondary | ICD-10-CM

## 2019-08-05 ENCOUNTER — Other Ambulatory Visit: Payer: Self-pay | Admitting: Physician Assistant

## 2019-08-27 ENCOUNTER — Ambulatory Visit (INDEPENDENT_AMBULATORY_CARE_PROVIDER_SITE_OTHER): Payer: BC Managed Care – PPO

## 2019-08-27 ENCOUNTER — Other Ambulatory Visit: Payer: Self-pay

## 2019-08-27 DIAGNOSIS — Z1231 Encounter for screening mammogram for malignant neoplasm of breast: Secondary | ICD-10-CM

## 2020-03-08 ENCOUNTER — Other Ambulatory Visit: Payer: Self-pay

## 2020-03-08 MED ORDER — LOSARTAN POTASSIUM 50 MG PO TABS: 50.0000 mg | ORAL_TABLET | Freq: Every day | ORAL | 0 refills | Status: DC

## 2020-03-17 ENCOUNTER — Encounter: Payer: BLUE CROSS/BLUE SHIELD | Admitting: Osteopathic Medicine

## 2020-03-18 ENCOUNTER — Other Ambulatory Visit: Payer: Self-pay | Admitting: Osteopathic Medicine

## 2020-03-18 DIAGNOSIS — Z1239 Encounter for other screening for malignant neoplasm of breast: Secondary | ICD-10-CM

## 2020-03-21 ENCOUNTER — Other Ambulatory Visit: Payer: Self-pay

## 2020-03-21 MED ORDER — MONTELUKAST SODIUM 10 MG PO TABS: 10.0000 mg | ORAL_TABLET | Freq: Every day | ORAL | 0 refills | Status: DC

## 2020-03-31 ENCOUNTER — Other Ambulatory Visit: Payer: Self-pay

## 2020-03-31 ENCOUNTER — Ambulatory Visit (INDEPENDENT_AMBULATORY_CARE_PROVIDER_SITE_OTHER): Payer: BC Managed Care – PPO | Admitting: Medical-Surgical

## 2020-03-31 ENCOUNTER — Encounter: Payer: Self-pay | Admitting: Medical-Surgical

## 2020-03-31 VITALS — BP 148/83 | HR 63 | Temp 98.3°F | Ht 61.0 in | Wt 137.3 lb

## 2020-03-31 DIAGNOSIS — E559 Vitamin D deficiency, unspecified: Secondary | ICD-10-CM | POA: Diagnosis not present

## 2020-03-31 DIAGNOSIS — I10 Essential (primary) hypertension: Secondary | ICD-10-CM

## 2020-03-31 DIAGNOSIS — Z0000 Encounter for general adult medical examination without abnormal findings: Secondary | ICD-10-CM

## 2020-03-31 DIAGNOSIS — Z9109 Other allergy status, other than to drugs and biological substances: Secondary | ICD-10-CM

## 2020-03-31 MED ORDER — LOSARTAN POTASSIUM 50 MG PO TABS: 50.0000 mg | ORAL_TABLET | Freq: Every day | ORAL | 3 refills | Status: DC

## 2020-03-31 MED ORDER — HYDROCHLOROTHIAZIDE 25 MG PO TABS: 25.0000 mg | ORAL_TABLET | Freq: Every day | ORAL | 3 refills | Status: DC

## 2020-03-31 MED ORDER — FLUTICASONE PROPIONATE 50 MCG/ACT NA SUSP: 1.0000 | Freq: Every day | NASAL | 3 refills | Status: DC

## 2020-03-31 MED ORDER — MONTELUKAST SODIUM 10 MG PO TABS: 10.0000 mg | ORAL_TABLET | Freq: Every day | ORAL | 3 refills | Status: DC

## 2020-03-31 NOTE — Progress Notes (Signed)
HPI: Penny Carpenter is a 59 y.o. female who  has a past medical history of Essential hypertension (02/08/2014) and Hypertension.  she presents to [facility] today, 03/31/20,  for chief complaint of: Annual physical exam  Hypertension-followed list with her with blood pressure readings from earlier in the year which all are within normal range.  Elevated at her appointment today but admits that she has not been as careful with her diet lately.  Taking losartan and HCTZ and tolerating them without any difficulty.  Vitamin D deficiency-taking vitamin D OTC supplementation daily.  Environmental allergies-taking Singulair 10 mg daily and using Flonase as prescribed.  Reports her allergies have been worse this year than before but she is managing fairly well.  Diet-overall eats a well-balanced diet with all food groups but notes that her husband has been bringing home sweets lately and she has been having difficulty turning them down.  Has been diagnosed with prediabetes and has been working with a wellness program at work to reduce her A1c.  Last check in April was 6% which was up from her last check.  She drinks mostly water.  Exercise-she is walking every day at work when she has breaks and lunges.  She also walks with her husband around the neighborhood in the afternoons.  She reports doing Zumba for her cardio.  Dental care every 6 months, next appointment in July. Eye exams yearly with her next one due in August. Up-to-date on mammogram until December.  Abdominal hysterectomy performed in 1996 but she is unsure if they removed her cervix or not.  I do see a result in the chart from a Pap smear completed in 2016 but it does say that the transformation zone was absent.  Past medical, surgical, social and family history reviewed:  Patient Active Problem List   Diagnosis Date Noted  . Allergic rhinitis 07/18/2017  . White coat syndrome with diagnosis of  hypertension 08/17/2016  . Environmental allergies 08/04/2014  . Left breast mass 06/15/2014  . Annual physical exam 02/17/2014  . Essential hypertension 02/08/2014  . Vitamin D deficiency 02/08/2014    Past Surgical History:  Procedure Laterality Date  . ABDOMINAL HYSTERECTOMY  1996   fibroids and bleeding   . gallbladder removed  2010    Social History   Tobacco Use  . Smoking status: Never Smoker  . Smokeless tobacco: Never Used  Substance Use Topics  . Alcohol use: Yes    Alcohol/week: 3.0 - 4.0 standard drinks    Types: 3 - 4 Glasses of wine per week    Family History  Problem Relation Age of Onset  . Heart attack Other        grandparent  . Diabetes Mother   . Hypertension Mother   . Breast cancer Mother   . Diabetes Father   . Hypertension Father   . Aortic dissection Father   . Kidney failure Father   . Stroke Father        tia/ministrokes, 2     Current medication list and allergy/intolerance information reviewed:    Current Outpatient Medications  Medication Sig Dispense Refill  . Coenzyme Q10 (COQ10 PO) Take 1 tablet by mouth daily.    . fluticasone (FLONASE) 50 MCG/ACT nasal spray Place 1-2 sprays into both nostrils daily. 48 g 3  . hydrochlorothiazide (HYDRODIURIL) 25 MG tablet Take 1 tablet (25 mg total) by mouth daily. 90 tablet 3  . losartan (COZAAR) 50 MG tablet Take  1 tablet (50 mg total) by mouth daily. 90 tablet 3  . MAGNESIUM CITRATE PO Take 1 tablet by mouth daily.    . Melatonin 1 MG/4ML LIQD Take by mouth.    . montelukast (SINGULAIR) 10 MG tablet Take 1 tablet (10 mg total) by mouth at bedtime. 90 tablet 3  . Multiple Vitamins-Minerals (MULTIVITAMIN PO) Take by mouth.    Marland Kitchen VITAMIN D, CHOLECALCIFEROL, PO Take by mouth.     No current facility-administered medications for this visit.    Allergies  Allergen Reactions  . Codeine     shaking  . Morphine And Related Itching    shaking  . Oxycodone-Acetaminophen Other (See Comments)     Unknown      Review of Systems:  Constitutional:  No  fever, no chills, No recent illness, No unintentional weight changes. No significant fatigue.   HEENT: No  headache, no vision change, no hearing change, No sore throat, No  sinus pressure  Cardiac: No  chest pain, No  pressure, No palpitations, No  Orthopnea  Respiratory:  No  shortness of breath. No  Cough  Gastrointestinal: No  abdominal pain, No  nausea, No  vomiting,  No  blood in stool, No  diarrhea, No  constipation   Musculoskeletal: No new myalgia/arthralgia  Skin: No  Rash, No other wounds/concerning lesions  Genitourinary: No  incontinence, No  abnormal genital bleeding, No abnormal genital discharge  Hem/Onc: No  easy bruising/bleeding, No  abnormal lymph node  Endocrine: No cold intolerance,  No heat intolerance. No polyuria/polydipsia/polyphagia   Neurologic: No  weakness, No  dizziness, No  slurred speech/focal weakness/facial droop  Psychiatric: No  concerns with depression, No  concerns with anxiety, No sleep problems, No mood problems  Exam:  BP (!) 148/83   Pulse 63   Temp 98.3 F (36.8 C) (Oral)   Ht 5' 1"  (1.549 m)   Wt 137 lb 4.8 oz (62.3 kg)   SpO2 99%   BMI 25.94 kg/m   Constitutional: VS see above. General Appearance: alert, well-developed, well-nourished, NAD  Eyes: Normal lids and conjunctive, non-icteric sclera  Ears, Nose, Mouth, Throat: MMM, Normal external inspection ears/nares/mouth/lips/gums. TM normal bilaterally. Pharynx/tonsils no erythema, no exudate. Nasal mucosa normal.   Neck: No masses, trachea midline. No thyroid enlargement. No tenderness/mass appreciated. No lymphadenopathy  Respiratory: Normal respiratory effort. no wheeze, no rhonchi, no rales  Cardiovascular: S1/S2 normal, no murmur, no rub/gallop auscultated. RRR. No lower extremity edema. Pedal pulse II/IV bilaterally DP and PT. No carotid bruit or JVD. No abdominal aortic bruit.  Gastrointestinal: Nontender, no  masses. No hepatomegaly, no splenomegaly. No hernia appreciated. Bowel sounds normal. Rectal exam deferred.   Musculoskeletal: Gait normal. No clubbing/cyanosis of digits.   Neurological: Normal balance/coordination. No tremor. No cranial nerve deficit on limited exam. Motor and sensation intact and symmetric. Cerebellar reflexes intact.   Skin: warm, dry, intact. No rash/ulcer. No concerning nevi or subq nodules on limited exam.    Psychiatric: Normal judgment/insight. Normal mood and affect. Oriented x3.    No results found for this or any previous visit (from the past 72 hour(s)).  No results found.   ASSESSMENT/PLAN:   1. Annual physical exam Checking CBC and CMP.  Patient brought lipid results from her check in April and we will upload these into the chart.  Up-to-date on preventative care at this time.  We will need clarification regarding her abdominal hysterectomy to see if she is due for a Pap smear. -  CBC - COMPLETE METABOLIC PANEL WITH GFR  2. Essential hypertension Checking CBC, CMP.  Continue losartan and HCTZ as prescribed refills provided. - CBC - COMPLETE METABOLIC PANEL WITH GFR  3. Vitamin D deficiency Continue OTC vitamin D supplementation.  Checking vitamin D level today. - Vitamin D 1,25 dihydroxy  4.  Environmental allergies Continue Singulair and Flonase as prescribed.  Refills provided.   Orders Placed This Encounter  Procedures  . CBC  . COMPLETE METABOLIC PANEL WITH GFR  . Vitamin D 1,25 dihydroxy    Meds ordered this encounter  Medications  . fluticasone (FLONASE) 50 MCG/ACT nasal spray    Sig: Place 1-2 sprays into both nostrils daily.    Dispense:  48 g    Refill:  3    Order Specific Question:   Supervising Provider    Answer:   Emeterio Reeve [id number]  . hydrochlorothiazide (HYDRODIURIL) 25 MG tablet    Sig: Take 1 tablet (25 mg total) by mouth daily.    Dispense:  90 tablet    Refill:  3    Order Specific Question:    Supervising Provider    Answer:   Emeterio Reeve [id number]  . losartan (COZAAR) 50 MG tablet    Sig: Take 1 tablet (50 mg total) by mouth daily.    Dispense:  90 tablet    Refill:  3    Order Specific Question:   Supervising Provider    Answer:   Emeterio Reeve [id number]  . montelukast (SINGULAIR) 10 MG tablet    Sig: Take 1 tablet (10 mg total) by mouth at bedtime.    Dispense:  90 tablet    Refill:  3    Order Specific Question:   Supervising Provider    Answer:   Emeterio Reeve [phone]    Patient Instructions  Preventive Care 75-23 Years Old, Female Preventive care refers to visits with your health care provider and lifestyle choices that can promote health and wellness. This includes:  A yearly physical exam. This may also be called an annual well check.  Regular dental visits and eye exams.  Immunizations.  Screening for certain conditions.  Healthy lifestyle choices, such as eating a healthy diet, getting regular exercise, not using drugs or products that contain nicotine and tobacco, and limiting alcohol use. What can I expect for my preventive care visit? Physical exam Your health care provider will check your:  Height and weight. This may be used to calculate body mass index (BMI), which tells if you are at a healthy weight.  Heart rate and blood pressure.  Skin for abnormal spots. Counseling Your health care provider may ask you questions about your:  Alcohol, tobacco, and drug use.  Emotional well-being.  Home and relationship well-being.  Sexual activity.  Eating habits.  Work and work Statistician.  Method of birth control.  Menstrual cycle.  Pregnancy history. What immunizations do I need?  Influenza (flu) vaccine  This is recommended every year. Tetanus, diphtheria, and pertussis (Tdap) vaccine  You may need a Td booster every 10 years. Varicella (chickenpox) vaccine  You may need this if you have not been  vaccinated. Zoster (shingles) vaccine  You may need this after age 48. Measles, mumps, and rubella (MMR) vaccine  You may need at least one dose of MMR if you were born in 1957 or later. You may also need a second dose. Pneumococcal conjugate (PCV13) vaccine  You may need this if you have certain conditions and were not  previously vaccinated. Pneumococcal polysaccharide (PPSV23) vaccine  You may need one or two doses if you smoke cigarettes or if you have certain conditions. Meningococcal conjugate (MenACWY) vaccine  You may need this if you have certain conditions. Hepatitis A vaccine  You may need this if you have certain conditions or if you travel or work in places where you may be exposed to hepatitis A. Hepatitis B vaccine  You may need this if you have certain conditions or if you travel or work in places where you may be exposed to hepatitis B. Haemophilus influenzae type b (Hib) vaccine  You may need this if you have certain conditions. Human papillomavirus (HPV) vaccine  If recommended by your health care provider, you may need three doses over 6 months. You may receive vaccines as individual doses or as more than one vaccine together in one shot (combination vaccines). Talk with your health care provider about the risks and benefits of combination vaccines. What tests do I need? Blood tests  Lipid and cholesterol levels. These may be checked every 5 years, or more frequently if you are over 65 years old.  Hepatitis C test.  Hepatitis B test. Screening  Lung cancer screening. You may have this screening every year starting at age 106 if you have a 30-pack-year history of smoking and currently smoke or have quit within the past 15 years.  Colorectal cancer screening. All adults should have this screening starting at age 98 and continuing until age 69. Your health care provider may recommend screening at age 48 if you are at increased risk. You will have tests every  1-10 years, depending on your results and the type of screening test.  Diabetes screening. This is done by checking your blood sugar (glucose) after you have not eaten for a while (fasting). You may have this done every 1-3 years.  Mammogram. This may be done every 1-2 years. Talk with your health care provider about when you should start having regular mammograms. This may depend on whether you have a family history of breast cancer.  BRCA-related cancer screening. This may be done if you have a family history of breast, ovarian, tubal, or peritoneal cancers.  Pelvic exam and Pap test. This may be done every 3 years starting at age 87. Starting at age 38, this may be done every 5 years if you have a Pap test in combination with an HPV test. Other tests  Sexually transmitted disease (STD) testing.  Bone density scan. This is done to screen for osteoporosis. You may have this scan if you are at high risk for osteoporosis. Follow these instructions at home: Eating and drinking  Eat a diet that includes fresh fruits and vegetables, whole grains, lean protein, and low-fat dairy.  Take vitamin and mineral supplements as recommended by your health care provider.  Do not drink alcohol if: ? Your health care provider tells you not to drink. ? You are pregnant, may be pregnant, or are planning to become pregnant.  If you drink alcohol: ? Limit how much you have to 0-1 drink a day. ? Be aware of how much alcohol is in your drink. In the [place] one drink equals one 12 oz bottle of beer (355 mL), one 5 oz glass of wine (148 mL), or one 1 oz glass of hard liquor (44 mL). Lifestyle  Take daily care of your teeth and gums.  Stay active. Exercise for at least 30 minutes on 5 or more days each week.  Do not use any products that contain nicotine or tobacco, such as cigarettes, e-cigarettes, and chewing tobacco. If you need help quitting, ask your health care provider.  If you are sexually active,  practice safe sex. Use a condom or other form of birth control (contraception) in order to prevent pregnancy and STIs (sexually transmitted infections).  If told by your health care provider, take low-dose aspirin daily starting at age 73. What's next?  Visit your health care provider once a year for a well check visit.  Ask your health care provider how often you should have your eyes and teeth checked.  Stay up to date on all vaccines. This information is not intended to replace advice given to you by your health care provider. Make sure you discuss any questions you have with your health care provider. Document Revised: 06/12/2018 Document Reviewed: 06/12/2018 Elsevier Patient Education  [place].   Follow-up plan: Return in about 1 year (around 03/31/2021) for annual physical exam.  Clearnce Sorrel, DNP, APRN, FNP-BC [place] [facility]

## 2020-03-31 NOTE — Patient Instructions (Signed)

## 2020-04-04 LAB — CBC
HCT: 42.3 % (ref 35.0–45.0)
Hemoglobin: 14.3 g/dL (ref 11.7–15.5)
MCH: 29.4 pg (ref 27.0–33.0)
MCHC: 33.8 g/dL (ref 32.0–36.0)
MCV: 87 fL (ref 80.0–100.0)
MPV: 10.9 fL (ref 7.5–12.5)
Platelets: 339 10*3/uL (ref 140–400)
RBC: 4.86 10*6/uL (ref 3.80–5.10)
RDW: 12.6 % (ref 11.0–15.0)
WBC: 7.2 10*3/uL (ref 3.8–10.8)

## 2020-04-04 LAB — COMPLETE METABOLIC PANEL WITH GFR
AG Ratio: 1.7 (calc) (ref 1.0–2.5)
ALT: 21 U/L (ref 6–29)
AST: 19 U/L (ref 10–35)
Albumin: 4.7 g/dL (ref 3.6–5.1)
Alkaline phosphatase (APISO): 76 U/L (ref 37–153)
BUN: 12 mg/dL (ref 7–25)
CO2: 28 mmol/L (ref 20–32)
Calcium: 10 mg/dL (ref 8.6–10.4)
Chloride: 103 mmol/L (ref 98–110)
Creat: 0.65 mg/dL (ref 0.50–1.05)
GFR, Est African American: 114 mL/min/{1.73_m2} (ref >=60)
GFR, Est Non African American: 99 mL/min/{1.73_m2} (ref >=60)
Globulin: 2.7 g/dL (calc) (ref 1.9–3.7)
Glucose, Bld: 94 mg/dL (ref 65–99)
Potassium: 4.1 mmol/L (ref 3.5–5.3)
Sodium: 141 mmol/L (ref 135–146)
Total Bilirubin: 0.4 mg/dL (ref 0.2–1.2)
Total Protein: 7.4 g/dL (ref 6.1–8.1)

## 2020-04-04 LAB — VITAMIN D 1,25 DIHYDROXY
Vitamin D 1, 25 (OH)2 Total: 71 pg/mL (ref 18–72)
Vitamin D2 1, 25 (OH)2: <8 pg/mL
Vitamin D3 1, 25 (OH)2: 71 pg/mL

## 2020-06-13 ENCOUNTER — Other Ambulatory Visit: Payer: Self-pay | Admitting: Osteopathic Medicine

## 2020-06-17 ENCOUNTER — Other Ambulatory Visit: Payer: Self-pay | Admitting: Osteopathic Medicine

## 2020-09-07 ENCOUNTER — Other Ambulatory Visit: Payer: Self-pay

## 2020-09-07 ENCOUNTER — Ambulatory Visit (INDEPENDENT_AMBULATORY_CARE_PROVIDER_SITE_OTHER): Payer: Managed Care, Other (non HMO)

## 2020-09-07 DIAGNOSIS — Z1231 Encounter for screening mammogram for malignant neoplasm of breast: Secondary | ICD-10-CM | POA: Diagnosis not present

## 2020-09-07 DIAGNOSIS — Z1239 Encounter for other screening for malignant neoplasm of breast: Secondary | ICD-10-CM

## 2020-09-15 ENCOUNTER — Ambulatory Visit: Payer: BC Managed Care – PPO

## 2021-02-04 ENCOUNTER — Emergency Department
Admission: EM | Admit: 2021-02-04 | Discharge: 2021-02-04 | Disposition: A | Discharge status: Home / Self Care | Payer: Managed Care, Other (non HMO) | Source: Home / Self Care | Attending: Family Medicine | Admitting: Family Medicine

## 2021-02-04 ENCOUNTER — Other Ambulatory Visit: Payer: Self-pay

## 2021-02-04 DIAGNOSIS — J22 Unspecified acute lower respiratory infection: Secondary | ICD-10-CM

## 2021-02-04 MED ORDER — AZITHROMYCIN 250 MG PO TABS: 250.0000 mg | ORAL_TABLET | Freq: Every day | ORAL | 0 refills | Status: DC

## 2021-02-04 MED ORDER — BENZONATATE 200 MG PO CAPS: 200.0000 mg | ORAL_CAPSULE | Freq: Two times a day (BID) | ORAL | 0 refills | Status: DC | PRN

## 2021-02-04 MED ORDER — PREDNISONE 20 MG PO TABS: 20.0000 mg | ORAL_TABLET | Freq: Two times a day (BID) | ORAL | 0 refills | Status: DC

## 2021-02-04 NOTE — ED Provider Notes (Signed)
[facility]    CSN: [id number] Arrival date & time: 02/04/21  1115      History   Chief Complaint Chief Complaint  Patient presents with  . Cough  . Nasal Congestion  . Facial Pain    HPI Penny Carpenter is a 60 y.o. female.   HPI   Patient states she is had cough cold runny nose sore throat for about a week.  She states that most of is getting better except for the cough.  Is very harsh cough.  It starting to hurt in her anterior chest every time she coughs.  She also feels short of breath when she has for coughing spells.  She has no fever or chills.  No headache or body ache.  She is vaccinated against COVID.  She did a home COVID test.  She has not been exposed to other infection or influenza that she knows of.  Past Medical History:  Diagnosis Date  . Essential hypertension 02/08/2014  . Hypertension     Patient Active Problem List   Diagnosis Date Noted  . Allergic rhinitis 07/18/2017  . White coat syndrome with diagnosis of hypertension 08/17/2016  . Environmental allergies 08/04/2014  . Left breast mass 06/15/2014  . Annual physical exam 02/17/2014  . Essential hypertension 02/08/2014  . Vitamin D deficiency 02/08/2014    Past Surgical History:  Procedure Laterality Date  . ABDOMINAL HYSTERECTOMY  1996   fibroids and bleeding   . CHOLECYSTECTOMY    . gallbladder removed  2010    OB History   No obstetric history on file.      Home Medications    Prior to Admission medications   Medication Sig Start Date End Date Taking? Authorizing Provider  azithromycin (ZITHROMAX) 250 MG tablet Take 1 tablet (250 mg total) by mouth daily. Take first 2 tablets together, then 1 every day until finished. 02/04/21  Yes Eustace Moore, MD  benzonatate (TESSALON) 200 MG capsule Take 1 capsule (200 mg total) by mouth 2 (two) times daily as needed for cough. 02/04/21  Yes Eustace Moore, MD  predniSONE (DELTASONE) 20 MG tablet Take 1 tablet (20 mg total)  by mouth 2 (two) times daily with a meal. 02/04/21  Yes Eustace Moore, MD  Coenzyme Q10 (COQ10 PO) Take 1 tablet by mouth daily.    [provider]  fluticasone (FLONASE) 50 MCG/ACT nasal spray Place 1-2 sprays into both nostrils daily. 03/31/20   Christen Butter, NP  hydrochlorothiazide (HYDRODIURIL) 25 MG tablet Take 1 tablet (25 mg total) by mouth daily. 03/31/20   Christen Butter, NP  losartan (COZAAR) 50 MG tablet TAKE 1 TABLET(50 MG) BY MOUTH DAILY 06/13/20   Sunnie Nielsen, DO  MAGNESIUM CITRATE PO Take 1 tablet by mouth daily.    [provider]  Melatonin 1 MG/4ML LIQD Take by mouth.    [provider]  montelukast (SINGULAIR) 10 MG tablet TAKE 1 TABLET BY MOUTH AT BEDTIME 06/17/20   Sunnie Nielsen, DO  Multiple Vitamins-Minerals (MULTIVITAMIN PO) Take by mouth.    [provider]  VITAMIN D, CHOLECALCIFEROL, PO Take by mouth.    [provider]    Family History Family History  Problem Relation Age of Onset  . Heart attack Other        grandparent  . Diabetes Mother   . Hypertension Mother   . Breast cancer Mother   . Diabetes Father   . Hypertension Father   . Aortic  dissection Father   . Kidney failure Father   . Stroke Father        tia/ministrokes, 2    Social History Social History   Tobacco Use  . Smoking status: Never Smoker  . Smokeless tobacco: Never Used  Vaping Use  . Vaping Use: Never used  Substance Use Topics  . Alcohol use: Yes    Alcohol/week: 1.0 standard drink    Types: 1 Glasses of wine per week    Comment: 3 x weekly  . Drug use: No     Allergies   Codeine, Morphine and related, Oxycodone-acetaminophen, and Citrus   Review of Systems Review of Systems Review HPI  Physical Exam Triage Vital Signs ED Triage Vitals  Enc Vitals Group     BP 02/04/21 1244 (!) 146/90     Pulse Rate 02/04/21 1244 90     Resp 02/04/21 1244 20     Temp 02/04/21 1244 98.7 F (37.1 C)     Temp src --       SpO2 02/04/21 1244 98 %     Weight 02/04/21 1235 133 lb (60.3 kg)     Height 02/04/21 1235 5\' 1"  (1.549 m)     Head Circumference --      Peak Flow --      Pain Score 02/04/21 1234 4     Pain Loc --      Pain Edu? --      Excl. in GC? --    No data found.  Updated Vital Signs BP (!) 146/90 (BP Location: Right Arm)   Pulse 90   Temp 98.7 F (37.1 C)   Resp 20   Ht 5\' 1"  (1.549 m)   Wt 60.3 kg   SpO2 98%   BMI 25.13 kg/m     Physical Exam Constitutional:      General: She is not in acute distress.    Appearance: She is well-developed and normal weight. She is ill-appearing.     Comments: Appears tired  HENT:     Head: Normocephalic and atraumatic.     Right Ear: Tympanic membrane, ear canal and external ear normal.     Left Ear: Tympanic membrane, ear canal and external ear normal.     Nose: Nose normal. No congestion.     Mouth/Throat:     Mouth: Mucous membranes are dry.     Pharynx: No posterior oropharyngeal erythema.  Eyes:     Conjunctiva/sclera: Conjunctivae normal.     Pupils: Pupils are equal, round, and reactive to light.  Cardiovascular:     Rate and Rhythm: Normal rate and regular rhythm.     Heart sounds: Normal heart sounds.  Pulmonary:     Effort: Pulmonary effort is normal. No respiratory distress.     Breath sounds: Normal breath sounds. No wheezing, rhonchi or rales.  Abdominal:     General: There is no distension.     Palpations: Abdomen is soft.  Musculoskeletal:        General: Normal range of motion.     Cervical back: Normal range of motion and neck supple.  Skin:    General: Skin is warm and dry.  Neurological:     Mental Status: She is alert.  Psychiatric:        Behavior: Behavior normal.      UC Treatments / Results  Labs (all labs ordered are listed, but only abnormal results are displayed) Labs Reviewed - No data to display  EKG   Radiology No results found.  Procedures Procedures (including critical care  time)  Medications Ordered in UC Medications - No data to display  Initial Impression / Assessment and Plan / UC Course  I have reviewed the triage vital signs and the nursing notes.  Pertinent labs & imaging results that were available during my care of the patient were reviewed by me and considered in my medical decision making (see chart for details).     Since this is lasted over a week I will cover her with an antibiotic.  Prednisone for the chest wall pain.  Tessalon for coughing.  Push fluids.Expect improvement in a couple days Final Clinical Impressions(s) / UC Diagnoses   Final diagnoses:  LRTI (lower respiratory tract infection)     Discharge Instructions     Take prednisone 2 times a day.  Take 2 doses today Take the azithromycin as directed.  2 pills today then 1 a day until gone Take Tessalon 2 times a day for cough. Drink lots of water Get plenty of rest   ED Prescriptions    Medication Sig Dispense Auth. Provider   predniSONE (DELTASONE) 20 MG tablet Take 1 tablet (20 mg total) by mouth 2 (two) times daily with a meal. 10 tablet Eustace Moore, MD   azithromycin (ZITHROMAX) 250 MG tablet Take 1 tablet (250 mg total) by mouth daily. Take first 2 tablets together, then 1 every day until finished. 6 tablet Eustace Moore, MD   benzonatate (TESSALON) 200 MG capsule Take 1 capsule (200 mg total) by mouth 2 (two) times daily as needed for cough. 20 capsule Eustace Moore, MD     PDMP not reviewed this encounter.   Eustace Moore, MD 02/04/21 [phone]

## 2021-02-04 NOTE — Discharge Instructions (Signed)
Take prednisone 2 times a day.  Take 2 doses today Take the azithromycin as directed.  2 pills today then 1 a day until gone Take Tessalon 2 times a day for cough. Drink lots of water Get plenty of rest

## 2021-02-04 NOTE — ED Triage Notes (Addendum)
Pt presents to Urgent Care with c/o productive cough (w/ associated chest tightness/pain), nasal congestion, watery/itchy eyes, and facial pain x 1 week. Reporting greenish sputum. Reports negative home COVID test yesterday; has been vaccinated against COVID and flu.

## 2021-04-06 ENCOUNTER — Encounter: Payer: BC Managed Care – PPO | Admitting: Osteopathic Medicine

## 2021-04-06 ENCOUNTER — Ambulatory Visit (INDEPENDENT_AMBULATORY_CARE_PROVIDER_SITE_OTHER): Payer: Managed Care, Other (non HMO) | Admitting: Osteopathic Medicine

## 2021-04-06 ENCOUNTER — Other Ambulatory Visit: Payer: Self-pay

## 2021-04-06 VITALS — BP 141/77 | HR 66 | Temp 97.9°F | Wt 137.1 lb

## 2021-04-06 DIAGNOSIS — Z0000 Encounter for general adult medical examination without abnormal findings: Secondary | ICD-10-CM | POA: Diagnosis not present

## 2021-04-06 DIAGNOSIS — R7303 Prediabetes: Secondary | ICD-10-CM

## 2021-04-06 DIAGNOSIS — I10 Essential (primary) hypertension: Secondary | ICD-10-CM

## 2021-04-06 DIAGNOSIS — Z23 Encounter for immunization: Secondary | ICD-10-CM | POA: Diagnosis not present

## 2021-04-06 DIAGNOSIS — E559 Vitamin D deficiency, unspecified: Secondary | ICD-10-CM | POA: Diagnosis not present

## 2021-04-06 MED ORDER — METFORMIN HCL ER 500 MG PO TB24: 500.0000 mg | ORAL_TABLET | Freq: Every day | ORAL | 3 refills | Status: DC

## 2021-04-06 MED ORDER — LOSARTAN POTASSIUM 50 MG PO TABS: ORAL_TABLET | ORAL | 3 refills | Status: DC

## 2021-04-06 MED ORDER — MONTELUKAST SODIUM 10 MG PO TABS: 1.0000 | ORAL_TABLET | Freq: Every day | ORAL | 3 refills | Status: DC

## 2021-04-06 MED ORDER — HYDROCHLOROTHIAZIDE 25 MG PO TABS: 25.0000 mg | ORAL_TABLET | Freq: Every day | ORAL | 3 refills | Status: DC

## 2021-04-06 NOTE — Progress Notes (Signed)
Penny Carpenter is a 60 y.o. female who presents to  [facility] at [facility]  today, 04/06/21, seeking care for the following:  Annual physical exam  New diagnosis prediabetes based on A1C from biometric screening 6.1 two years in a row.      ASSESSMENT & PLAN with other pertinent findings:  The primary encounter diagnosis was Annual physical exam. Diagnoses of Need for Tdap vaccination, Vitamin D deficiency, White coat syndrome with diagnosis of hypertension, Prediabetes, and Essential hypertension were also pertinent to this visit.   1. Annual physical exam See preventive care reviewed as below   2. Need for Tdap vaccination done  3. Vitamin D deficiency Labs today  4. White coat syndrome with diagnosis of hypertension Home BP ok  5. Prediabetes Starting Metformin XR 500 mg daily and repeating A1C in 6 mos, discussed risks/benefits of medication and reasons to call us if problems   6. Essential hypertension Meds refilled    There are no Patient Instructions on file for this visit.  Orders Placed This Encounter  Procedures   Tdap vaccine greater than or equal to 7yo IM   CBC   COMPLETE METABOLIC PANEL WITH GFR   VITAMIN D 25 Hydroxy (Vit-D Deficiency, Fractures)    Meds ordered this encounter  Medications   metFORMIN (GLUCOPHAGE XR) 500 MG 24 hr tablet    Sig: Take 1 tablet (500 mg total) by mouth daily with breakfast.    Dispense:  90 tablet    Refill:  3   hydrochlorothiazide (HYDRODIURIL) 25 MG tablet    Sig: Take 1 tablet (25 mg total) by mouth daily.    Dispense:  90 tablet    Refill:  3   losartan (COZAAR) 50 MG tablet    Sig: TAKE 1 TABLET(50 MG) BY MOUTH DAILY    Dispense:  90 tablet    Refill:  3   montelukast (SINGULAIR) 10 MG tablet    Sig: Take 1 tablet (10 mg total) by mouth daily.    Dispense:  90 tablet    Refill:  3   General Preventive Care Most recent routine screening labs: reviewed labs  done (sugars, cholesterol). Need metabolic panel and blood count.  Blood pressure goal 130/80 or less.  Tobacco: don't! Please let me know if you need help quitting! Alcohol: responsible moderation is ok for most adults - if you have concerns about your alcohol intake, please talk to me!  Exercise: as tolerated to reduce risk of cardiovascular disease and diabetes. Strength training will also prevent osteoporosis.  Mental health: if need for mental health care (medicines, counseling, other), or concerns about moods, please let me know!  Sexual / Reproductive health: if need for STI testing, or if concerns with libido/pain problems, please let me know!  Advanced Directive: Living Will and/or Healthcare Power of Attorney recommended for all adults, regardless of age or health.  Vaccines Flu vaccine: for almost everyone, every fall.  Shingles vaccine: done!  Pneumonia vaccines: after age 44 Tetanus booster: every 10 years, due 2032 COVID vaccine: THANKS for getting your vaccine! :)  Cancer screenings  Colon cancer screening: due 05/2026 Breast cancer screening: mammogram every other year at least, or option to do annually.  Cervical cancer screening: Pap not needed w/ hysterectomy Lung cancer screening: not needed for non-smokers   Infection screenings  HIV: recommended screening at least once age 15-65, more often as needed. Gonorrhea/Chlamydia, other STI: screening as needed Hepatitis C:  done, repeat as needed  TB: certain at-risk populations, or depending on work requirements and/or travel history Other Bone Density Test: recommended for women at age 26       See below for relevant physical exam findings  See below for recent lab and imaging results reviewed  Medications, allergies, PMH, PSH, SocH, FamH reviewed below    Follow-up instructions: Return in about 6 months (around 10/06/2021) for IN-OFFICE VISIT MONITOR A1C - PREDIABETES  .                                        Exam:  BP (!) 141/77 (BP Location: Left Arm, Patient Position: Sitting, Cuff Size: Normal)   Pulse 66   Temp 97.9 F (36.6 C) (Oral)   Wt 137 lb 1.3 oz (62.2 kg)   BMI 25.90 kg/m  Constitutional: VS see above. General Appearance: alert, well-developed, well-nourished, NAD Neck: No masses, trachea midline.  Respiratory: Normal respiratory effort. no wheeze, no rhonchi, no rales Cardiovascular: S1/S2 normal, no murmur, no rub/gallop auscultated. RRR.  Musculoskeletal: Gait normal. Symmetric and independent movement of all extremities Abdominal: non-tender, non-distended, no appreciable organomegaly, neg Murphy's, BS [id number] Neurological: Normal balance/coordination. No tremor. Skin: warm, dry, intact.  Psychiatric: Normal judgment/insight. Normal mood and affect. Oriented x3.   Current Meds  Medication Sig   Coenzyme Q10 (COQ10 PO) Take 1 tablet by mouth daily.   fluticasone (FLONASE) 50 MCG/ACT nasal spray Place 1-2 sprays into both nostrils daily.   MAGNESIUM CITRATE PO Take 1 tablet by mouth daily.   Melatonin 1 MG/4ML LIQD Take by mouth.   metFORMIN (GLUCOPHAGE XR) 500 MG 24 hr tablet Take 1 tablet (500 mg total) by mouth daily with breakfast.   Multiple Vitamins-Minerals (MULTIVITAMIN PO) Take by mouth.   VITAMIN D, CHOLECALCIFEROL, PO Take by mouth.   [DISCONTINUED] hydrochlorothiazide (HYDRODIURIL) 25 MG tablet Take 1 tablet (25 mg total) by mouth daily.   [DISCONTINUED] losartan (COZAAR) 50 MG tablet TAKE 1 TABLET(50 MG) BY MOUTH DAILY   [DISCONTINUED] montelukast (SINGULAIR) 10 MG tablet TAKE 1 TABLET BY MOUTH AT BEDTIME   [DISCONTINUED] predniSONE (DELTASONE) 20 MG tablet Take 1 tablet (20 mg total) by mouth 2 (two) times daily with a meal.    Allergies  Allergen Reactions   Codeine     shaking   Morphine And Related Itching    shaking   Oxycodone-Acetaminophen Other (See Comments)     Unknown   Citrus Rash    Patient Active Problem List   Diagnosis Date Noted   Prediabetes 04/06/2021   Allergic rhinitis 07/18/2017   White coat syndrome with diagnosis of hypertension 08/17/2016   Environmental allergies 08/04/2014   Left breast mass 06/15/2014   Annual physical exam 02/17/2014   Essential hypertension 02/08/2014   Vitamin D deficiency 02/08/2014    Family History  Problem Relation Age of Onset   Heart attack Other        grandparent   Diabetes Mother    Hypertension Mother    Breast cancer Mother    Diabetes Father    Hypertension Father    Aortic dissection Father    Kidney failure Father    Stroke Father        tia/ministrokes, 2    Social History   Tobacco Use  Smoking Status Never  Smokeless Tobacco Never    Past Surgical History:  Procedure Laterality Date  ABDOMINAL HYSTERECTOMY  1996   fibroids and bleeding    CHOLECYSTECTOMY     gallbladder removed  2010    Immunization History  Administered Date(s) Administered   Influenza,inj,Quad PF,6+ Mos 07/06/2016, 07/18/2017   Influenza-Unspecified 10/15/2009, 07/30/2012, 07/15/2013, 07/11/2015, 07/07/2018   PFIZER(Purple Top)SARS-COV-2 Vaccination 12/18/2019, 01/15/2020   Td 10/15/2005   Tdap 04/03/2011, 04/06/2021   Zoster Recombinat (Shingrix) 05/05/2018, 08/08/2018    No results found for this or any previous visit (from the past 2160 hour(s)).  No results found.     All questions at time of visit were answered - patient instructed to contact office with any additional concerns or updates. ER/RTC precautions were reviewed with the patient as applicable.   Please note: manual typing as well as voice recognition software may have been used to produce this document - typos may escape review. Please contact Dr. Lyn Hollingshead for any needed clarifications.

## 2021-04-07 LAB — COMPLETE METABOLIC PANEL WITH GFR
AG Ratio: 1.8 (calc) (ref 1.0–2.5)
ALT: 23 U/L (ref 6–29)
AST: 20 U/L (ref 10–35)
Albumin: 4.5 g/dL (ref 3.6–5.1)
Alkaline phosphatase (APISO): 80 U/L (ref 37–153)
BUN: 17 mg/dL (ref 7–25)
CO2: 28 mmol/L (ref 20–32)
Calcium: 9.5 mg/dL (ref 8.6–10.4)
Chloride: 103 mmol/L (ref 98–110)
Creat: 0.67 mg/dL (ref 0.50–1.05)
GFR, Est African American: 112 mL/min/{1.73_m2} (ref >=60)
GFR, Est Non African American: 97 mL/min/{1.73_m2} (ref >=60)
Globulin: 2.5 g/dL (calc) (ref 1.9–3.7)
Glucose, Bld: 98 mg/dL (ref 65–99)
Potassium: 3.8 mmol/L (ref 3.5–5.3)
Sodium: 141 mmol/L (ref 135–146)
Total Bilirubin: 0.4 mg/dL (ref 0.2–1.2)
Total Protein: 7 g/dL (ref 6.1–8.1)

## 2021-04-07 LAB — CBC
HCT: 40.5 % (ref 35.0–45.0)
Hemoglobin: 13.7 g/dL (ref 11.7–15.5)
MCH: 29.8 pg (ref 27.0–33.0)
MCHC: 33.8 g/dL (ref 32.0–36.0)
MCV: 88.2 fL (ref 80.0–100.0)
MPV: 10.6 fL (ref 7.5–12.5)
Platelets: 332 10*3/uL (ref 140–400)
RBC: 4.59 10*6/uL (ref 3.80–5.10)
RDW: 12.7 % (ref 11.0–15.0)
WBC: 6.3 10*3/uL (ref 3.8–10.8)

## 2021-04-07 LAB — VITAMIN D 25 HYDROXY (VIT D DEFICIENCY, FRACTURES): Vit D, 25-Hydroxy: 45 ng/mL (ref 30–100)

## 2021-08-18 ENCOUNTER — Other Ambulatory Visit: Payer: Self-pay

## 2021-08-18 MED ORDER — METFORMIN HCL ER 500 MG PO TB24: 500.0000 mg | ORAL_TABLET | Freq: Every day | ORAL | 1 refills | Status: DC

## 2021-08-18 MED ORDER — MONTELUKAST SODIUM 10 MG PO TABS: 10.0000 mg | ORAL_TABLET | Freq: Every day | ORAL | 1 refills | Status: DC

## 2021-08-18 MED ORDER — HYDROCHLOROTHIAZIDE 25 MG PO TABS: 25.0000 mg | ORAL_TABLET | Freq: Every day | ORAL | 1 refills | Status: DC

## 2021-08-18 MED ORDER — LOSARTAN POTASSIUM 50 MG PO TABS: ORAL_TABLET | ORAL | 1 refills | Status: DC

## 2021-09-14 ENCOUNTER — Other Ambulatory Visit: Payer: Self-pay | Admitting: Medical-Surgical

## 2021-09-14 DIAGNOSIS — Z1231 Encounter for screening mammogram for malignant neoplasm of breast: Secondary | ICD-10-CM

## 2021-09-21 ENCOUNTER — Ambulatory Visit (INDEPENDENT_AMBULATORY_CARE_PROVIDER_SITE_OTHER): Payer: Managed Care, Other (non HMO)

## 2021-09-21 ENCOUNTER — Other Ambulatory Visit: Payer: Self-pay

## 2021-09-21 DIAGNOSIS — Z1231 Encounter for screening mammogram for malignant neoplasm of breast: Secondary | ICD-10-CM

## 2021-10-10 ENCOUNTER — Ambulatory Visit: Payer: Managed Care, Other (non HMO) | Admitting: Medical-Surgical

## 2021-10-10 ENCOUNTER — Ambulatory Visit: Payer: Managed Care, Other (non HMO) | Admitting: Osteopathic Medicine

## 2021-11-02 ENCOUNTER — Ambulatory Visit: Payer: Managed Care, Other (non HMO) | Admitting: Medical-Surgical

## 2021-12-11 ENCOUNTER — Ambulatory Visit: Payer: Managed Care, Other (non HMO) | Admitting: Medical-Surgical

## 2022-01-14 ENCOUNTER — Encounter: Payer: Self-pay | Admitting: Medical-Surgical

## 2022-01-18 ENCOUNTER — Ambulatory Visit: Payer: Managed Care, Other (non HMO) | Admitting: Medical-Surgical

## 2022-01-18 ENCOUNTER — Encounter: Payer: Self-pay | Admitting: Medical-Surgical

## 2022-01-18 VITALS — BP 113/78 | HR 80 | Resp 20 | Ht 61.0 in | Wt 132.1 lb

## 2022-01-18 DIAGNOSIS — Z7689 Persons encountering health services in other specified circumstances: Secondary | ICD-10-CM | POA: Diagnosis not present

## 2022-01-18 DIAGNOSIS — R7303 Prediabetes: Secondary | ICD-10-CM

## 2022-01-18 DIAGNOSIS — I10 Essential (primary) hypertension: Secondary | ICD-10-CM

## 2022-01-18 DIAGNOSIS — M79671 Pain in right foot: Secondary | ICD-10-CM

## 2022-01-18 NOTE — Progress Notes (Signed)
?  HPI with pertinent ROS:  ? ?CC: Transfer of care ? ?HPI: ?Pleasant 60 year old female presenting today to transfer care to a new PCP and for the following: ? ?Hypertension-has been drinking more water as well as taking hydrochlorothiazide 25 mg daily and losartan 50 mg daily.  Tolerating both medications well without side effects.  She does check her blood pressure at home and has readings that are at goal.  Is surprised that her blood pressure is good in office today because she usually has whitecoat syndrome. Denies CP, SOB, palpitations, lower extremity edema, dizziness, headaches, or vision changes. ? ?Prediabetes-notes that she is taking metformin 500 mg nightly as prescribed.  She does have a significantly reduced appetite and has to remind herself to eat.  Her goal is to eventually get off the metformin.  She is going to a class regarding preventing type 2 diabetes.  Will be having biometrics done in about 1 month to check her A1c and cholesterol which she will forward to [place]. ? ?Having right foot pain-Notes that its been a problem along the medial right MTP as well as the dorsal aspect of the forefoot.  She has been using massage and wearing supportive shoes but notes that flat shoes make it much worse.  She was doing exercise the other night and noted that pointing her foot and doing certain movements were very painful.  She would like a referral to podiatry to discuss treatment options. ? ?I reviewed the past medical history, family history, social history, surgical history, and allergies today and no changes were needed.  Please see the problem list section below in epic for further details. ? ? ?Physical exam:  ? ?General: Well Developed, well nourished, and in no acute distress.  ?Neuro: Alert and oriented x3, extra-ocular muscles intact, sensation grossly intact.  ?HEENT: Normocephalic, atraumatic, pupils equal round reactive to light, neck supple, no masses, no lymphadenopathy, thyroid nonpalpable.   ?Skin: Warm and dry, no rashes. ?Cardiac: Regular rate and rhythm, no murmurs rubs or gallops, no lower extremity edema.  ?Respiratory: Clear to auscultation bilaterally. Not using accessory muscles, speaking in full sentences. ? ?Impression and Recommendations:   ? ?1. Encounter to establish care ?Reviewed available information and discussed care concerns with patient.  ? ?2. Right foot pain ?It looks like she has a bunion starting on the right MTP joint.  May also have some tarsal bossing related to osteoarthritis.  Referring to podiatry per patient request ? ?3. Essential hypertension ?Blood pressure looks great here in office today so continue hydrochlorothiazide and losartan as prescribed.  When she needs a refill, we can switch to a combination pill rather than prescribe them separately. ? ?4. Prediabetes ?Managing her weight as well as her dietary habits will be key to being able to controlled prediabetes and stop metformin.  For now, continue metformin 500 mg nightly.  Forward [place] the results of her upcoming A1c and cholesterol for upload into our files. ? ?Return in about 6 months (around 07/20/2022) for chronic disease follow up. ?___________________________________________ ?Thayer Ohm, DNP, APRN, FNP-BC ?Primary Care and Sports Medicine ?[facility] ?

## 2022-02-01 ENCOUNTER — Ambulatory Visit (INDEPENDENT_AMBULATORY_CARE_PROVIDER_SITE_OTHER): Payer: Managed Care, Other (non HMO)

## 2022-02-01 ENCOUNTER — Ambulatory Visit: Payer: Managed Care, Other (non HMO) | Admitting: Podiatry

## 2022-02-01 ENCOUNTER — Encounter: Payer: Self-pay | Admitting: Podiatry

## 2022-02-01 DIAGNOSIS — M21612 Bunion of left foot: Secondary | ICD-10-CM

## 2022-02-01 DIAGNOSIS — M21611 Bunion of right foot: Secondary | ICD-10-CM

## 2022-02-01 DIAGNOSIS — M19079 Primary osteoarthritis, unspecified ankle and foot: Secondary | ICD-10-CM

## 2022-02-01 DIAGNOSIS — M79671 Pain in right foot: Secondary | ICD-10-CM | POA: Diagnosis not present

## 2022-02-01 DIAGNOSIS — M79672 Pain in left foot: Secondary | ICD-10-CM | POA: Diagnosis not present

## 2022-02-01 NOTE — Progress Notes (Signed)
?  Subjective:  ?Patient ID: Penny Carpenter, female    DOB: [birth year],   MRN: [id number] ? ?Chief Complaint  ?Patient presents with  ? Foot Pain  ?  Bilateral foot pain  ? ? ?61 y.o. female presents for concern of right foot pain that has been going on for about 2-3 years. Relates the pain is on and off depending on shoe gears. Relates most of the pain is on the top of the foot and in the area of her bunion. Relates it also hurts on left but worse on right. Rest helps. Denies any other treatments. . Denies any other pedal complaints. Denies n/v/f/c.  ? ?Past Medical History:  ?Diagnosis Date  ? Essential hypertension 02/08/2014  ? Hypertension   ? ? ?Objective:  ?Physical Exam: ?Vascular: DP/PT pulses 2/4 bilateral. CFT <3 seconds. Normal hair growth on digits. No edema.  ?Skin. No lacerations or abrasions bilateral feet.  ?Musculoskeletal: MMT 5/5 bilateral lower extremities in DF, PF, Inversion and Eversion. Deceased ROM in DF of ankle joint. Tender over the dorsum of the right foot around the medial TMTJs. Some tenderness over medial eminence of bilateral HAV deformities more so on the right.  ?Neurological: Sensation intact to light touch.  ? ?Assessment:  ? ?1. Arthritis of midfoot   ?2. Bilateral bunions   ? ? ? ?Plan:  ?Patient was evaluated and treated and all questions answered. ?-Xrays reviewed. No acute fractures of dislocations. 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  Mild degenerative changes noted to the midfoot.  ?-Discussed HAV and midfoot arthirits  and treatment options;conservative and surgical management; risks, benefits, alternatives discussed. All patient's questions answered. ?-Discussed padding and wide shoe gear.   ?-Discussed NSAIDS, topicals, and possible injections.  ?-Recommend continue with good supportive shoes and inserts.  ?-Discussed surgical options.  ?-Patient to return to office as needed or sooner if condition worsens. ? ? ? ? ?Louann Sjogren, DPM  ? ? ?

## 2022-03-07 LAB — LIPID PANEL
Cholesterol: 154 (ref 0–200)
HDL: 56 (ref 35–70)
LDL Cholesterol: 80
Triglycerides: 91 (ref 40–160)

## 2022-03-07 LAB — HEMOGLOBIN A1C: Hemoglobin A1C: 5.8

## 2022-03-07 LAB — BASIC METABOLIC PANEL: Glucose: 98

## 2022-04-11 NOTE — Progress Notes (Unsigned)
   Complete physical exam  Patient: Penny Carpenter   DOB: Sep 12, 1961   61 y.o. Female  MRN: [id number]  Subjective:    No chief complaint on file.   Penny Carpenter is a 61 y.o. female who presents today for a complete physical exam. She reports consuming a {diet types:17450} diet. {types:19826} She generally feels {DESC; WELL/FAIRLY WELL/POORLY:18703}. She reports sleeping {DESC; WELL/FAIRLY WELL/POORLY:18703}. She {does/does not:200015} have additional problems to discuss today.    Most recent fall risk assessment:    01/18/2022   10:01 AM  Fall Risk   Falls in the past year? 0  Number falls in past yr: 0  Injury with Fall? 0  Risk for fall due to : No Fall Risks  Follow up Falls evaluation completed     Most recent depression screenings:    01/18/2022   10:00 AM 04/06/2021    9:20 AM  PHQ 2/9 Scores  PHQ - 2 Score 0 2    {VISON DENTAL STD PSA (Optional):27386}  {History (Optional):23778}  Patient Care Team: Christen Butter, NP as PCP - General (Nurse Practitioner)   Outpatient Medications Prior to Visit  Medication Sig   Coenzyme Q10 (COQ10 PO) Take 1 tablet by mouth daily.   cyanocobalamin 100 MCG tablet Take 1 tablet by mouth daily.   fluticasone (FLONASE) 50 MCG/ACT nasal spray Place 1-2 sprays into both nostrils daily.   hydrochlorothiazide (HYDRODIURIL) 25 MG tablet Take 1 tablet (25 mg total) by mouth daily.   losartan (COZAAR) 50 MG tablet TAKE 1 TABLET(50 MG) BY MOUTH DAILY   MAGNESIUM CITRATE PO Take 1 tablet by mouth daily.   Melatonin 1 MG/4ML LIQD Take by mouth.   metFORMIN (GLUCOPHAGE XR) 500 MG 24 hr tablet Take 1 tablet (500 mg total) by mouth daily with breakfast.   montelukast (SINGULAIR) 10 MG tablet Take 1 tablet (10 mg total) by mouth daily.   Multiple Vitamins-Minerals (MULTIVITAMIN PO) Take by mouth.   VITAMIN D, CHOLECALCIFEROL, PO Take by mouth.   No facility-administered medications prior to visit.    ROS        Objective:     There  were no vitals taken for this visit. {Vitals History (Optional):23777}  Physical Exam   No results found for any visits on 04/12/22. {Show previous labs (optional):23779}    Assessment & Plan:    Routine Health Maintenance and Physical Exam  Immunization History  Administered Date(s) Administered   Influenza,inj,Quad PF,6+ Mos 07/06/2016, 07/18/2017   Influenza-Unspecified 10/15/2009, 07/30/2012, 07/15/2013, 07/11/2015, 07/07/2018   PFIZER(Purple Top)SARS-COV-2 Vaccination 12/18/2019, 01/15/2020   Td 10/15/2005   Tdap 04/03/2011, 04/06/2021   Zoster Recombinat (Shingrix) 05/05/2018, 08/08/2018    Health Maintenance  Topic Date Due   COVID-19 Vaccine (3 - Pfizer series) 03/11/2020   INFLUENZA VACCINE  05/15/2022   MAMMOGRAM  09/22/2023   COLONOSCOPY (Pts 45-53yrs Insurance coverage will need to be confirmed)  05/29/2026   TETANUS/TDAP  04/07/2031   Hepatitis C Screening  Completed   HIV Screening  Completed   Zoster Vaccines- Shingrix  Completed   HPV VACCINES  Aged Out    Discussed health benefits of physical activity, and encouraged her to engage in regular exercise appropriate for her age and condition.  Problem List Items Addressed This Visit   None  No follow-ups on file.     Christen Butter, NP

## 2022-04-12 ENCOUNTER — Ambulatory Visit (INDEPENDENT_AMBULATORY_CARE_PROVIDER_SITE_OTHER): Payer: Managed Care, Other (non HMO) | Admitting: Medical-Surgical

## 2022-04-12 ENCOUNTER — Encounter: Payer: Self-pay | Admitting: Medical-Surgical

## 2022-04-12 VITALS — BP 137/75 | HR 69 | Resp 20 | Ht 61.0 in | Wt 135.9 lb

## 2022-04-12 DIAGNOSIS — I10 Essential (primary) hypertension: Secondary | ICD-10-CM | POA: Diagnosis not present

## 2022-04-12 DIAGNOSIS — R7303 Prediabetes: Secondary | ICD-10-CM | POA: Diagnosis not present

## 2022-04-12 DIAGNOSIS — Z0000 Encounter for general adult medical examination without abnormal findings: Secondary | ICD-10-CM

## 2022-04-12 MED ORDER — NYSTATIN 100000 UNIT/GM EX CREA: 1.0000 | TOPICAL_CREAM | Freq: Two times a day (BID) | CUTANEOUS | 6 refills | Status: AC

## 2022-04-12 MED ORDER — VALSARTAN-HYDROCHLOROTHIAZIDE 80-12.5 MG PO TABS: 1.0000 | ORAL_TABLET | Freq: Every day | ORAL | 1 refills | Status: DC

## 2022-04-13 LAB — COMPLETE METABOLIC PANEL WITH GFR
AG Ratio: 1.4 (calc) (ref 1.0–2.5)
ALT: 16 U/L (ref 6–29)
AST: 18 U/L (ref 10–35)
Albumin: 4.2 g/dL (ref 3.6–5.1)
Alkaline phosphatase (APISO): 71 U/L (ref 37–153)
BUN: 13 mg/dL (ref 7–25)
CO2: 26 mmol/L (ref 20–32)
Calcium: 9.8 mg/dL (ref 8.6–10.4)
Chloride: 105 mmol/L (ref 98–110)
Creat: 0.73 mg/dL (ref 0.50–1.03)
Globulin: 2.9 g/dL (calc) (ref 1.9–3.7)
Glucose, Bld: 92 mg/dL (ref 65–99)
Potassium: 3.8 mmol/L (ref 3.5–5.3)
Sodium: 142 mmol/L (ref 135–146)
Total Bilirubin: 0.6 mg/dL (ref 0.2–1.2)
Total Protein: 7.1 g/dL (ref 6.1–8.1)
eGFR: 95 mL/min/{1.73_m2} (ref >=60)

## 2022-04-13 LAB — CBC WITH DIFFERENTIAL/PLATELET
Absolute Monocytes: 428 cells/uL (ref 200–950)
Basophils Absolute: 41 cells/uL (ref 0–200)
Basophils Relative: 0.6 %
Eosinophils Absolute: 197 cells/uL (ref 15–500)
Eosinophils Relative: 2.9 %
HCT: 39.1 % (ref 35.0–45.0)
Hemoglobin: 13.1 g/dL (ref 11.7–15.5)
Lymphs Abs: 3577 cells/uL (ref 850–3900)
MCH: 29.7 pg (ref 27.0–33.0)
MCHC: 33.5 g/dL (ref 32.0–36.0)
MCV: 88.7 fL (ref 80.0–100.0)
MPV: 10.6 fL (ref 7.5–12.5)
Monocytes Relative: 6.3 %
Neutro Abs: 2557 cells/uL (ref 1500–7800)
Neutrophils Relative %: 37.6 %
Platelets: 296 10*3/uL (ref 140–400)
RBC: 4.41 10*6/uL (ref 3.80–5.10)
RDW: 12.9 % (ref 11.0–15.0)
Total Lymphocyte: 52.6 %
WBC: 6.8 10*3/uL (ref 3.8–10.8)

## 2022-04-19 ENCOUNTER — Encounter: Payer: Self-pay | Admitting: Medical-Surgical

## 2022-05-03 ENCOUNTER — Ambulatory Visit: Payer: Managed Care, Other (non HMO)

## 2022-05-10 ENCOUNTER — Ambulatory Visit (INDEPENDENT_AMBULATORY_CARE_PROVIDER_SITE_OTHER): Payer: Managed Care, Other (non HMO) | Admitting: Medical-Surgical

## 2022-05-10 ENCOUNTER — Encounter: Payer: Self-pay | Admitting: Medical-Surgical

## 2022-05-10 VITALS — BP 154/64 | HR 65

## 2022-05-10 DIAGNOSIS — I10 Essential (primary) hypertension: Secondary | ICD-10-CM | POA: Diagnosis not present

## 2022-05-10 MED ORDER — VALSARTAN-HYDROCHLOROTHIAZIDE 160-12.5 MG PO TABS: 1.0000 | ORAL_TABLET | Freq: Every day | ORAL | 1 refills | Status: DC

## 2022-05-10 NOTE — Progress Notes (Signed)
   Established Patient Office Visit  Subjective   Patient ID: Penny Carpenter, female    DOB: Jul 21, 1961  Age: 61 y.o. MRN: [id number]  Chief Complaint  Patient presents with   Hypertension    HPI  Penny Carpenter is here for blood pressure check. She does report headaches. Home reading yesterday was 145/84. Denies chest pain, shortness of breath or dizziness.  ROS    Objective:     BP (!) 151/71   Pulse 65   SpO2 98%    Physical Exam   No results found for any visits on 05/10/22.    The ASCVD Risk score (Arnett DK, et al., 2019) failed to calculate for the following reasons:   Cannot find a previous HDL lab   Cannot find a previous total cholesterol lab    Assessment & Plan:  Hypertension - Per Joy, patient advised to pick up new prescription of Valsartan-HCTZ 160/12.5 mg. Follow up in 2 weeks for nurse visit blood pressure check.   Problem List Items Addressed This Visit       Unprioritized   Essential hypertension - Primary (Chronic)    Return in about 2 weeks (around 05/24/2022) for nurse visit BP check. Earna Coder, Janalyn Harder, CMA

## 2022-05-10 NOTE — Progress Notes (Signed)
Agree with documentation as below.  ___________________________________________ Syria Kestner L. [place] Folden, DNP, APRN, FNP-BC Primary Care and Sports Medicine [facility] [place]  

## 2022-05-21 ENCOUNTER — Encounter: Payer: Self-pay | Admitting: Medical-Surgical

## 2022-05-24 ENCOUNTER — Ambulatory Visit: Payer: Managed Care, Other (non HMO)

## 2022-06-01 ENCOUNTER — Other Ambulatory Visit: Payer: Self-pay | Admitting: Medical-Surgical

## 2022-06-06 ENCOUNTER — Other Ambulatory Visit: Payer: Self-pay | Admitting: Osteopathic Medicine

## 2022-06-13 ENCOUNTER — Other Ambulatory Visit: Payer: Self-pay | Admitting: Osteopathic Medicine

## 2022-07-19 ENCOUNTER — Ambulatory Visit (INDEPENDENT_AMBULATORY_CARE_PROVIDER_SITE_OTHER): Payer: Managed Care, Other (non HMO) | Admitting: Medical-Surgical

## 2022-07-19 ENCOUNTER — Encounter: Payer: Self-pay | Admitting: Medical-Surgical

## 2022-07-19 VITALS — BP 164/94 | HR 59 | Resp 20 | Ht 61.0 in | Wt 131.2 lb

## 2022-07-19 DIAGNOSIS — R7303 Prediabetes: Secondary | ICD-10-CM | POA: Diagnosis not present

## 2022-07-19 DIAGNOSIS — I10 Essential (primary) hypertension: Secondary | ICD-10-CM | POA: Diagnosis not present

## 2022-07-19 DIAGNOSIS — E559 Vitamin D deficiency, unspecified: Secondary | ICD-10-CM | POA: Diagnosis not present

## 2022-07-19 DIAGNOSIS — Z23 Encounter for immunization: Secondary | ICD-10-CM | POA: Diagnosis not present

## 2022-07-19 LAB — POCT GLYCOSYLATED HEMOGLOBIN (HGB A1C): HbA1c, POC (controlled diabetic range): 5.5 % (ref 0.0–7.0)

## 2022-07-19 MED ORDER — METFORMIN HCL ER 500 MG PO TB24: 500.0000 mg | ORAL_TABLET | ORAL | 1 refills | Status: DC

## 2022-07-19 MED ORDER — VALSARTAN-HYDROCHLOROTHIAZIDE 160-12.5 MG PO TABS: 1.0000 | ORAL_TABLET | Freq: Every day | ORAL | 1 refills | Status: DC

## 2022-07-19 NOTE — Progress Notes (Signed)
Established Patient Office Visit  Subjective   Patient ID: Penny Carpenter, female   DOB: [birth year] Age: 61 y.o. MRN: [id number]   Chief Complaint  Patient presents with   Follow-up   Hypertension    HPI 61 year old female presenting today for follow-up on:  Hypertension: checking BP at home, most readings at goal but has had some readings that were high. Taking valsartan-hydrochlorothiazide 160-12.5 mg daily, tolerating well without side effects.  Does note that she "fell off the wagon" on her trip to [place] and her dietary and exercise practices have been poor since her return.  She is working on getting back into her regular routine and hopes that that will help. Denies CP, SOB, palpitations, lower extremity edema, dizziness, headaches, or vision changes.  Prediabetes: Currently taking metformin 500 mg daily, tolerating well without side effects.  When she was in [place], her dietary habits were not as clean as usual but she has gotten back to her regular habits.  She is doing a class on preventing diabetes and is educating herself by reading books on how to prevent diabetes as well as what diabetes does to the body.  She has returned to walking for exercise and is happy to report she only gained 1 pound while she was on vacation.    Objective:    Vitals:   07/19/22 1130 07/19/22 1148  BP: (!) 146/78 (!) 164/94  Pulse: 71 (!) 59  Resp: 20   Height: 5\' 1"  (1.549 m)   Weight: 131 lb 3.2 oz (59.5 kg)   SpO2: 98%   BMI (Calculated): 24.8     Physical Exam Vitals and nursing note reviewed.  Constitutional:      General: She is not in acute distress.    Appearance: Normal appearance. She is normal weight. She is not ill-appearing.  HENT:     Head: Normocephalic and atraumatic.  Cardiovascular:     Rate and Rhythm: Normal rate and regular rhythm.     Pulses: Normal pulses.     Heart sounds: Normal heart sounds.  Pulmonary:     Effort: Pulmonary effort is normal. No  respiratory distress.     Breath sounds: Normal breath sounds. No wheezing, rhonchi or rales.  Skin:    General: Skin is warm and dry.  Neurological:     Mental Status: She is alert and oriented to person, place, and time.  Psychiatric:        Mood and Affect: Mood normal.        Behavior: Behavior normal.        Thought Content: Thought content normal.        Judgment: Judgment normal.    Results for orders placed or performed in visit on 07/19/22 (from the past 24 hour(s))  POCT HgB A1C     Status: None   Collection Time: 07/19/22 11:38 AM  Result Value Ref Range   Hemoglobin A1C     HbA1c POC (<> result, manual entry)     HbA1c, POC (prediabetic range)     HbA1c, POC (controlled diabetic range) 5.5 0.0 - 7.0 %       The ASCVD Risk score (Arnett DK, et al., 2019) failed to calculate for the following reasons:   Cannot find a previous HDL lab   Cannot find a previous total cholesterol lab   Assessment & Plan:   1. Essential hypertension Blood pressure elevated on arrival and again on recheck however she does have her home readings which  show her systolics in the 364W-803O range since starting her blood pressure medication at the higher dose.  Her diastolics have been averaging in the 70s-low 80s.  No adjustment of medications in the setting of known whitecoat syndrome.  Continue valsartan/hydrochlorothiazide 160-12.5 mg daily as prescribed.  Limit dietary sodium and recommend regular intentional exercise at least 3 times weekly.  Monitor blood pressure at home with a goal of 130/80 or less.  2. Prediabetes POCT hemoglobin A1c 5.5% today.  Since she is doing so well and is getting back to her regular dietary habits, reducing metformin to 500 mg 3 times weekly. - POCT HgB A1C  3. Need for influenza vaccination Flu vaccine given in office today. - Flu Vaccine QUAD 6+ mos PF IM (Fluarix Quad PF)   Return in about 6 months (around 01/18/2023) for chronic disease follow  up.  ___________________________________________ Clearnce Sorrel, DNP, APRN, FNP-BC Primary Care and [place]

## 2022-10-03 ENCOUNTER — Other Ambulatory Visit: Payer: Self-pay | Admitting: Medical-Surgical

## 2022-10-03 DIAGNOSIS — Z1231 Encounter for screening mammogram for malignant neoplasm of breast: Secondary | ICD-10-CM

## 2022-10-13 ENCOUNTER — Other Ambulatory Visit: Payer: Self-pay | Admitting: Medical-Surgical

## 2022-11-01 ENCOUNTER — Encounter: Payer: Self-pay | Admitting: Medical-Surgical

## 2022-11-02 ENCOUNTER — Telehealth (INDEPENDENT_AMBULATORY_CARE_PROVIDER_SITE_OTHER): Payer: Managed Care, Other (non HMO) | Admitting: Medical-Surgical

## 2022-11-02 ENCOUNTER — Encounter: Payer: Self-pay | Admitting: Medical-Surgical

## 2022-11-02 DIAGNOSIS — U071 COVID-19: Secondary | ICD-10-CM

## 2022-11-02 MED ORDER — NIRMATRELVIR/RITONAVIR (PAXLOVID)TABLET: 3.0000 | ORAL_TABLET | Freq: Two times a day (BID) | ORAL | 0 refills | Status: AC

## 2022-11-02 MED ORDER — FLUTICASONE PROPIONATE 50 MCG/ACT NA SUSP: 1.0000 | Freq: Every day | NASAL | 3 refills | Status: DC

## 2022-11-02 NOTE — Progress Notes (Signed)
Virtual Visit via Video Note  I connected with Penny Carpenter on 11/02/22 at 11:10 AM EST by a video enabled telemedicine application and verified that I am speaking with the correct person using two identifiers.   I discussed the limitations of evaluation and management by telemedicine and the availability of in person appointments. The patient expressed understanding and agreed to proceed.  Patient location: home Provider locations: office  Subjective:    CC: COVID-19 positive  HPI: Pleasant 62 year old female presenting via MyChart video visit with reports of 2 days of upper respiratory symptoms including mild sore throat, sinus congestion cough, headache, and mild chills.  She has had no fever, chest pain, shortness of breath, nausea, vomiting, diarrhea.  Tested for COVID 2 days ago with negative result however her test yesterday was positive.  She has been increasing rest.  Taking Mucinex at night with minimal to moderate relief of symptoms.  Past medical history, Surgical history, Family history not pertinant except as noted below, Social history, Allergies, and medications have been entered into the medical record, reviewed, and corrections made.   Review of Systems: See HPI for pertinent positives and negatives.   Objective:    General: Speaking clearly in complete sentences without any shortness of breath.  Alert and oriented x3.  Normal judgment. No apparent acute distress.  Impression and Recommendations:    1. COVID-19 virus infection Start Paxlovid twice daily x 5 days.  Continue conservative measures with over-the-counter cough and cold preparations.  Symptoms are mild for now to monitor for worsening and if this occurs, let us know.  I discussed the assessment and treatment plan with the patient. The patient was provided an opportunity to ask questions and all were answered. The patient agreed with the plan and demonstrated an understanding of the instructions.   The  patient was advised to call back or seek an in-person evaluation if the symptoms worsen or if the condition fails to improve as anticipated.  20 minutes of non-face-to-face time was provided during this encounter.  Return if symptoms worsen or fail to improve.  Clearnce Sorrel, DNP, APRN, FNP-BC [facility]

## 2022-11-07 ENCOUNTER — Ambulatory Visit: Payer: Managed Care, Other (non HMO)

## 2022-12-10 ENCOUNTER — Other Ambulatory Visit: Payer: Self-pay | Admitting: Pharmacist

## 2022-12-10 NOTE — Patient Instructions (Signed)
Ms. Onion,  Thank you for speaking with me today! As discussed, continue checking blood pressures at home, and taking medications regularly.   Take care, Luana Shu, PharmD Clinical Pharmacist [facility] [phone]

## 2022-12-10 NOTE — Progress Notes (Signed)
Patient appearing on report for [employer] [facility] - Hypertension Control report due to last documented ambulatory blood pressure of 164/94 on 07/19/22. Next appointment with PCP is 01/24/23   Outreached patient to discuss hypertension control and medication management.   Current antihypertensives: valsartan-hctz 160-12.'5mg'$  once daily  Patient has an automated upper arm home BP machine.  Dehydration, lack of sleep  Current blood pressure readings: 110-120s at home   Patient denies hypotensive signs and symptoms including dizziness, lightheadedness.  Patient denies hypertensive symptoms including headache, chest pain, shortness of breath.   Assessment/Plan: - Currently controlled - - Reviewed goal blood pressure <130/80 - Reviewed appropriate home BP monitoring technique (avoid caffeine, smoking, and exercise for 30 minutes before checking, rest for at least 5 minutes before taking BP, sit with feet flat on the floor and back against a hard surface, uncross legs, and rest arm on flat surface) - Reviewed to check blood pressure periodically, document, and provide at next provider visit - Recommend continue current regimen  Larinda Buttery, PharmD Clinical Pharmacist [facility] [phone]

## 2022-12-10 NOTE — Progress Notes (Signed)
Patient appearing on report for [employer] [facility] - Hypertension Control report due to last documented ambulatory blood pressure of 164/94 on 07/19/2022. Next appointment with PCP is 01/24/23   Outreached patient to discuss hypertension control and medication management. Left voicemail for patient to return my call at their convenience.   Larinda Buttery, PharmD Clinical Pharmacist [facility] [phone]

## 2022-12-12 ENCOUNTER — Ambulatory Visit (INDEPENDENT_AMBULATORY_CARE_PROVIDER_SITE_OTHER): Payer: Managed Care, Other (non HMO)

## 2022-12-12 DIAGNOSIS — Z1231 Encounter for screening mammogram for malignant neoplasm of breast: Secondary | ICD-10-CM

## 2023-01-24 ENCOUNTER — Encounter: Payer: Self-pay | Admitting: Medical-Surgical

## 2023-01-24 ENCOUNTER — Ambulatory Visit (INDEPENDENT_AMBULATORY_CARE_PROVIDER_SITE_OTHER): Payer: Managed Care, Other (non HMO) | Admitting: Medical-Surgical

## 2023-01-24 VITALS — BP 132/83 | HR 64 | Resp 20 | Ht 61.0 in | Wt 134.4 lb

## 2023-01-24 DIAGNOSIS — K449 Diaphragmatic hernia without obstruction or gangrene: Secondary | ICD-10-CM | POA: Diagnosis not present

## 2023-01-24 DIAGNOSIS — R7303 Prediabetes: Secondary | ICD-10-CM | POA: Diagnosis not present

## 2023-01-24 DIAGNOSIS — I10 Essential (primary) hypertension: Secondary | ICD-10-CM | POA: Diagnosis not present

## 2023-01-24 DIAGNOSIS — J301 Allergic rhinitis due to pollen: Secondary | ICD-10-CM | POA: Diagnosis not present

## 2023-01-24 LAB — CBC WITH DIFFERENTIAL/PLATELET
Basophils Relative: 0.3 %
Eosinophils Absolute: 210 cells/uL (ref 15–500)
Eosinophils Relative: 3 %
MCHC: 33.1 g/dL (ref 32.0–36.0)
MCV: 87.4 fL (ref 80.0–100.0)
Neutrophils Relative %: 44.8 %
Platelets: 392 10*3/uL (ref 140–400)

## 2023-01-24 MED ORDER — VALSARTAN-HYDROCHLOROTHIAZIDE 160-12.5 MG PO TABS
1.0000 | ORAL_TABLET | Freq: Every day | ORAL | 1 refills | Status: DC
Start: 2023-01-24 — End: 2023-07-22

## 2023-01-24 MED ORDER — MONTELUKAST SODIUM 10 MG PO TABS: 10.0000 mg | ORAL_TABLET | Freq: Every day | ORAL | 1 refills | Status: DC

## 2023-01-24 MED ORDER — METFORMIN HCL ER 500 MG PO TB24
500.0000 mg | ORAL_TABLET | ORAL | 1 refills | Status: DC
Start: 2023-01-25 — End: 2023-09-05

## 2023-01-24 NOTE — Assessment & Plan Note (Signed)
>>  ASSESSMENT AND PLAN FOR ALLERGIC RHINITIS WRITTEN ON 01/24/2023  8:14 PM BY Maaliyah Adolph, NP  History of allergic rhinitis currently flaring due to the high levels of pollen recently.  Currently managed with Flonase nasal spray and Singulair 10 mg nightly.  Tolerating both medications well without side effects.  Does feel that the medications are helping.  Mild sinus congestion and postnasal drip today but no severe symptoms.  Continue Singulair and Flonase as prescribed.  Consider an over-the-counter antihistamine such as Zyrtec or Claritin.

## 2023-01-24 NOTE — Progress Notes (Signed)
        Established patient visit  History, exam, impression, and plan:  Prediabetes Taking metformin 500 mg 3 times weekly.  Increased dosing to daily caused significant GI issues.  Continues to stay physically active.  Has had some indiscretions over the past few months in her diet.  Rechecking A1c.  Continue to limit intake of simple carbohydrates and concentrated sweets.  Continue metformin as prescribed.  Essential hypertension Pleasant 62 year old female with a history of hypertension complicated by whitecoat syndrome.  Has been checking blood pressures at home with readings at or below goal.  Taking valsartan/hydrochlorothiazide 160-12.5 mg daily, tolerating well without side effects.  Follows a low-sodium diet.  Stays physically active.  Denies concerning symptoms.  On exam, RRR, normal S1/S2.  No peripheral edema.  Lungs CTA, respirations even and unlabored.  Blood pressure elevated on arrival however recheck was better.  Updating labs.  Continue valsartan-hydrochlorothiazide as prescribed.  Allergic rhinitis History of allergic rhinitis currently flaring due to the high levels of pollen recently.  Currently managed with Flonase nasal spray and Singulair 10 mg nightly.  Tolerating both medications well without side effects.  Does feel that the medications are helping.  Mild sinus congestion and postnasal drip today but no severe symptoms.  Continue Singulair and Flonase as prescribed.  Consider an over-the-counter antihistamine such as Zyrtec or Claritin.  Hiatal hernia Reports having some abdominal pain that was evaluated in March.  At that time, had a CT of the abdomen and pelvis which identified a small hiatal hernia.  She is treating this with as needed Tums which is working well for her.  No further abdominal pain concerns.   Procedures performed this visit: None.  Return in about 6 months (around 07/26/2023) for HTN follow up.  __________________________________ Thayer Ohm,  DNP, APRN, FNP-BC Primary Care and Sports Medicine [facility] [place]

## 2023-01-24 NOTE — Assessment & Plan Note (Addendum)
Taking metformin 500 mg 3 times weekly.  Increased dosing to daily caused significant GI issues.  Continues to stay physically active.  Has had some indiscretions over the past few months in her diet.  Rechecking A1c.  Continue to limit intake of simple carbohydrates and concentrated sweets.  Continue metformin as prescribed.

## 2023-01-24 NOTE — Assessment & Plan Note (Signed)
History of allergic rhinitis currently flaring due to the high levels of pollen recently.  Currently managed with Flonase nasal spray and Singulair 10 mg nightly.  Tolerating both medications well without side effects.  Does feel that the medications are helping.  Mild sinus congestion and postnasal drip today but no severe symptoms.  Continue Singulair and Flonase as prescribed.  Consider an over-the-counter antihistamine such as Zyrtec or Claritin.

## 2023-01-24 NOTE — Assessment & Plan Note (Signed)
Pleasant 62 year old female with a history of hypertension complicated by whitecoat syndrome.  Has been checking blood pressures at home with readings at or below goal.  Taking valsartan/hydrochlorothiazide 160-12.5 mg daily, tolerating well without side effects.  Follows a low-sodium diet.  Stays physically active.  Denies concerning symptoms.  On exam, RRR, normal S1/S2.  No peripheral edema.  Lungs CTA, respirations even and unlabored.  Blood pressure elevated on arrival however recheck was better.  Updating labs.  Continue valsartan-hydrochlorothiazide as prescribed.

## 2023-01-24 NOTE — Assessment & Plan Note (Signed)
Reports having some abdominal pain that was evaluated in March.  At that time, had a CT of the abdomen and pelvis which identified a small hiatal hernia.  She is treating this with as needed Tums which is working well for her.  No further abdominal pain concerns.

## 2023-01-25 LAB — COMPLETE METABOLIC PANEL WITH GFR
AG Ratio: 1.6 (calc) (ref 1.0–2.5)
ALT: 19 U/L (ref 6–29)
AST: 21 U/L (ref 10–35)
Albumin: 4.4 g/dL (ref 3.6–5.1)
Alkaline phosphatase (APISO): 78 U/L (ref 37–153)
BUN: 14 mg/dL (ref 7–25)
CO2: 27 mmol/L (ref 20–32)
Calcium: 9.8 mg/dL (ref 8.6–10.4)
Chloride: 105 mmol/L (ref 98–110)
Creat: 0.67 mg/dL (ref 0.50–1.05)
Globulin: 2.8 g/dL (calc) (ref 1.9–3.7)
Glucose, Bld: 97 mg/dL (ref 65–99)
Potassium: 4.2 mmol/L (ref 3.5–5.3)
Sodium: 142 mmol/L (ref 135–146)
Total Bilirubin: 0.4 mg/dL (ref 0.2–1.2)
Total Protein: 7.2 g/dL (ref 6.1–8.1)
eGFR: 100 mL/min/{1.73_m2} (ref >=60)

## 2023-01-25 LAB — CBC WITH DIFFERENTIAL/PLATELET
Absolute Monocytes: 539 cells/uL (ref 200–950)
Basophils Absolute: 21 cells/uL (ref 0–200)
HCT: 41.7 % (ref 35.0–45.0)
Hemoglobin: 13.8 g/dL (ref 11.7–15.5)
Lymphs Abs: 3094 cells/uL (ref 850–3900)
MCH: 28.9 pg (ref 27.0–33.0)
MPV: 9.9 fL (ref 7.5–12.5)
Monocytes Relative: 7.7 %
Neutro Abs: 3136 cells/uL (ref 1500–7800)
RBC: 4.77 10*6/uL (ref 3.80–5.10)
RDW: 12.7 % (ref 11.0–15.0)
Total Lymphocyte: 44.2 %
WBC: 7 10*3/uL (ref 3.8–10.8)

## 2023-01-25 LAB — HEMOGLOBIN A1C
Hgb A1c MFr Bld: 5.8 % of total Hgb — ABNORMAL HIGH (ref <5.7)
Mean Plasma Glucose: 120 mg/dL
eAG (mmol/L): 6.6 mmol/L

## 2023-01-25 LAB — LIPID PANEL
Cholesterol: 178 mg/dL (ref <200)
HDL: 65 mg/dL (ref >=50)
LDL Cholesterol (Calc): 95 mg/dL (calc)
Non-HDL Cholesterol (Calc): 113 mg/dL (calc) (ref <130)
Total CHOL/HDL Ratio: 2.7 (calc) (ref <5.0)
Triglycerides: 89 mg/dL (ref <150)

## 2023-02-20 ENCOUNTER — Encounter: Payer: Self-pay | Admitting: Medical-Surgical

## 2023-02-21 ENCOUNTER — Encounter: Payer: Self-pay | Admitting: Medical-Surgical

## 2023-02-28 ENCOUNTER — Inpatient Hospital Stay: Payer: Managed Care, Other (non HMO) | Admitting: Medical-Surgical

## 2023-04-01 ENCOUNTER — Encounter: Payer: Self-pay | Admitting: Medical-Surgical

## 2023-07-21 ENCOUNTER — Other Ambulatory Visit: Payer: Self-pay | Admitting: Medical-Surgical

## 2023-07-21 DIAGNOSIS — J301 Allergic rhinitis due to pollen: Secondary | ICD-10-CM

## 2023-07-21 DIAGNOSIS — I10 Essential (primary) hypertension: Secondary | ICD-10-CM

## 2023-08-01 ENCOUNTER — Ambulatory Visit: Payer: Managed Care, Other (non HMO) | Admitting: Medical-Surgical

## 2023-08-21 ENCOUNTER — Other Ambulatory Visit: Payer: Self-pay | Admitting: Medical-Surgical

## 2023-08-21 DIAGNOSIS — I10 Essential (primary) hypertension: Secondary | ICD-10-CM

## 2023-08-21 DIAGNOSIS — J301 Allergic rhinitis due to pollen: Secondary | ICD-10-CM

## 2023-08-29 ENCOUNTER — Ambulatory Visit: Payer: Managed Care, Other (non HMO) | Admitting: Medical-Surgical

## 2023-09-05 ENCOUNTER — Ambulatory Visit: Payer: Managed Care, Other (non HMO) | Admitting: Medical-Surgical

## 2023-09-05 ENCOUNTER — Encounter: Payer: Self-pay | Admitting: Medical-Surgical

## 2023-09-05 VITALS — BP 138/85 | HR 69 | Resp 20 | Ht 61.0 in | Wt 141.4 lb

## 2023-09-05 DIAGNOSIS — R7303 Prediabetes: Secondary | ICD-10-CM

## 2023-09-05 DIAGNOSIS — I10 Essential (primary) hypertension: Secondary | ICD-10-CM | POA: Diagnosis not present

## 2023-09-05 DIAGNOSIS — J301 Allergic rhinitis due to pollen: Secondary | ICD-10-CM

## 2023-09-05 LAB — POCT GLYCOSYLATED HEMOGLOBIN (HGB A1C)
HbA1c, POC (prediabetic range): 5.6 % — AB (ref 5.7–6.4)
Hemoglobin A1C: 5.6 % (ref 4.0–5.6)

## 2023-09-05 MED ORDER — OMEGA-3-ACID ETHYL ESTERS 1 G PO CAPS: 1.0000 g | ORAL_CAPSULE | Freq: Every day | ORAL | 3 refills | Status: AC

## 2023-09-05 MED ORDER — METFORMIN HCL ER 500 MG PO TB24
500.0000 mg | ORAL_TABLET | ORAL | 3 refills | Status: DC
Start: 2023-09-06 — End: 2024-08-12

## 2023-09-05 MED ORDER — MONTELUKAST SODIUM 10 MG PO TABS: 10.0000 mg | ORAL_TABLET | Freq: Every day | ORAL | 0 refills | Status: DC

## 2023-09-05 MED ORDER — VALSARTAN-HYDROCHLOROTHIAZIDE 160-12.5 MG PO TABS: 1.0000 | ORAL_TABLET | Freq: Every day | ORAL | 3 refills | Status: DC

## 2023-09-05 MED ORDER — FLUTICASONE PROPIONATE 50 MCG/ACT NA SUSP: 1.0000 | Freq: Every day | NASAL | 3 refills | Status: AC

## 2023-09-05 NOTE — Progress Notes (Signed)
        Established patient visit  History, exam, impression, and plan:  1. Prediabetes Pleasant 62 year old female presenting today for follow up on prediabetes.  Has been taking metformin 500 mg XR 1 tablet 3 times weekly.  Also using cinnamon in her coffee every morning.  Has been walking for exercise and being more mindful of hidden sugars and carbohydrates in her foods.  Mostly following a very healthy low-carb diet but does occasionally indulge.  POCT A1c recheck today at 5.6%.  She is doing great on this current regimen.  Continue dietary modification, metformin, and regular intentional exercise. - POCT HgB A1C - metFORMIN (GLUCOPHAGE XR) 500 MG 24 hr tablet; Take 1 tablet (500 mg total) by mouth 3 (three) times a week.  Dispense: 36 tablet; Refill: 3  2. Non-seasonal allergic rhinitis due to pollen She does have a history of allergic rhinitis which is currently managed by using Flonase and Singulair on a daily basis.  This seems to work well for her and keeps her symptoms well-controlled.  Plan to continue as prescribed. - montelukast (SINGULAIR) 10 MG tablet; Take 1 tablet (10 mg total) by mouth daily.  Dispense: 30 tablet; Refill: 0  3. Essential hypertension History of hypertension complicated by whitecoat hypertension.  She has been monitoring blood pressures at home which show readings at or below goal.  Taking valsartan-hydrochlorothiazide 160-12.5 mg daily.  Tolerating well without side effects.  Denies concerning symptoms.  Cardiopulmonary exam is normal today.  Work on increasing regular intentional exercise.  Watch out for hidden sodium in foods.  Continue valsartan-HCTZ as prescribed. - valsartan-hydrochlorothiazide (DIOVAN-HCT) 160-12.5 MG tablet; Take 1 tablet by mouth daily.  Dispense: 90 tablet; Refill: 3  Procedures performed this visit: None.  Return in about 6 months (around 03/04/2024) for chronic disease follow up.  __________________________________ Thayer Ohm,  DNP, APRN, FNP-BC Primary Care and Sports Medicine [facility] [place]

## 2023-09-18 ENCOUNTER — Other Ambulatory Visit: Payer: Self-pay | Admitting: Medical-Surgical

## 2023-09-18 DIAGNOSIS — J301 Allergic rhinitis due to pollen: Secondary | ICD-10-CM

## 2023-12-02 ENCOUNTER — Other Ambulatory Visit (HOSPITAL_BASED_OUTPATIENT_CLINIC_OR_DEPARTMENT_OTHER): Payer: Self-pay | Admitting: Medical-Surgical

## 2023-12-02 DIAGNOSIS — Z139 Encounter for screening, unspecified: Secondary | ICD-10-CM

## 2023-12-15 IMAGING — DX DG FOOT COMPLETE 3+V*R*
3 series · 3 of 3 positions shown · non-contrast
Comparison: None.

CLINICAL DATA: Right medial and distal first metatarsal foot pain.

EXAM:
RIGHT FOOT COMPLETE - 3+ VIEW

[foot ap wb]
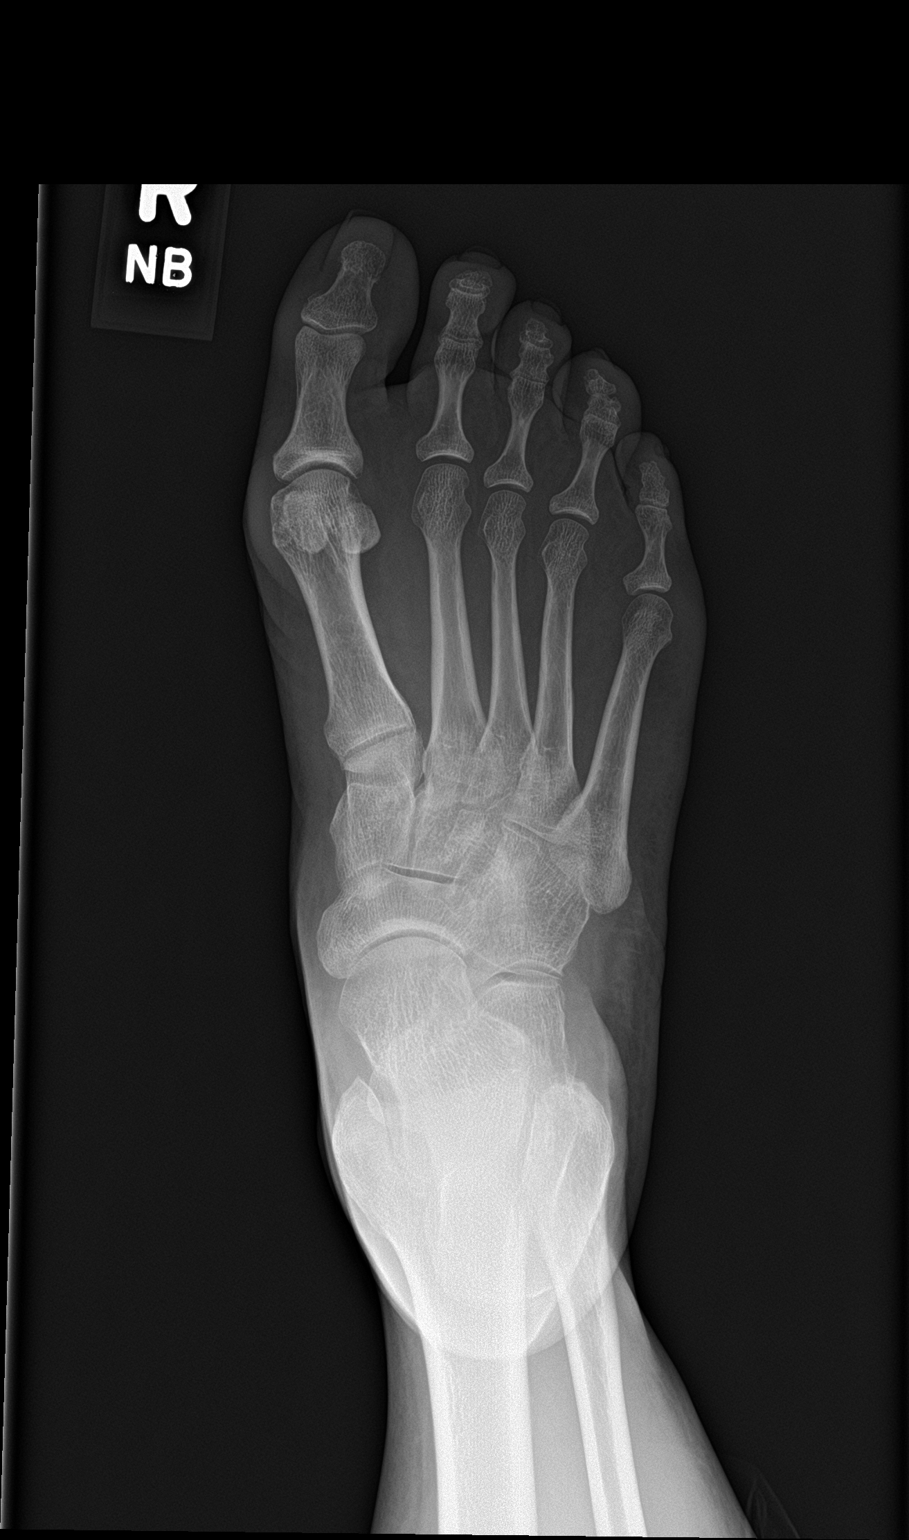

[foot obl wb]
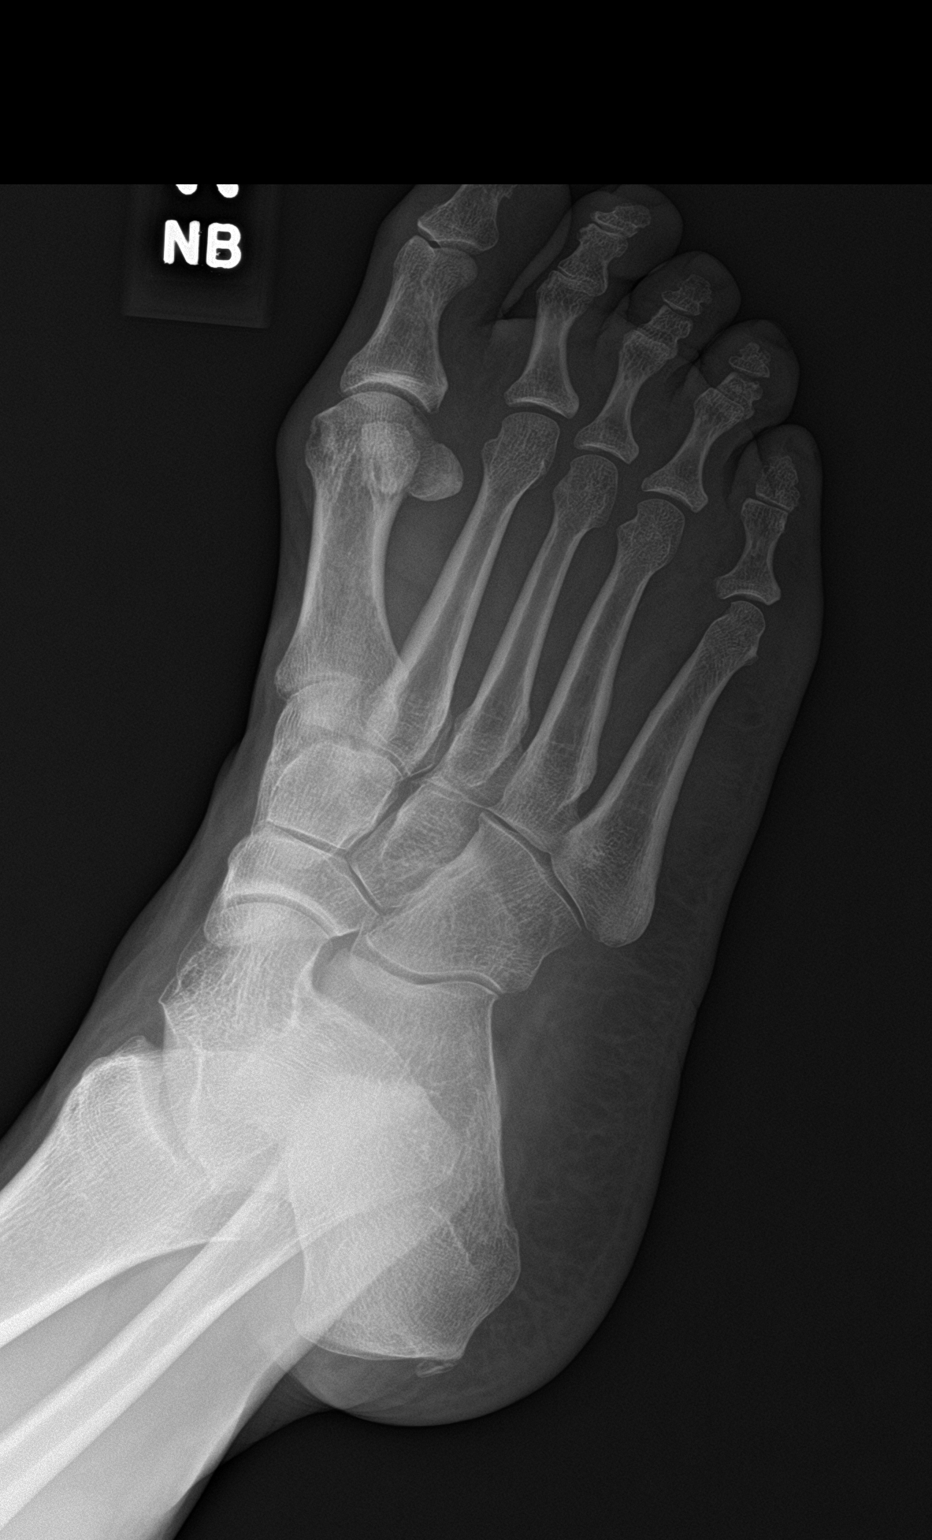

[foot lat wb]
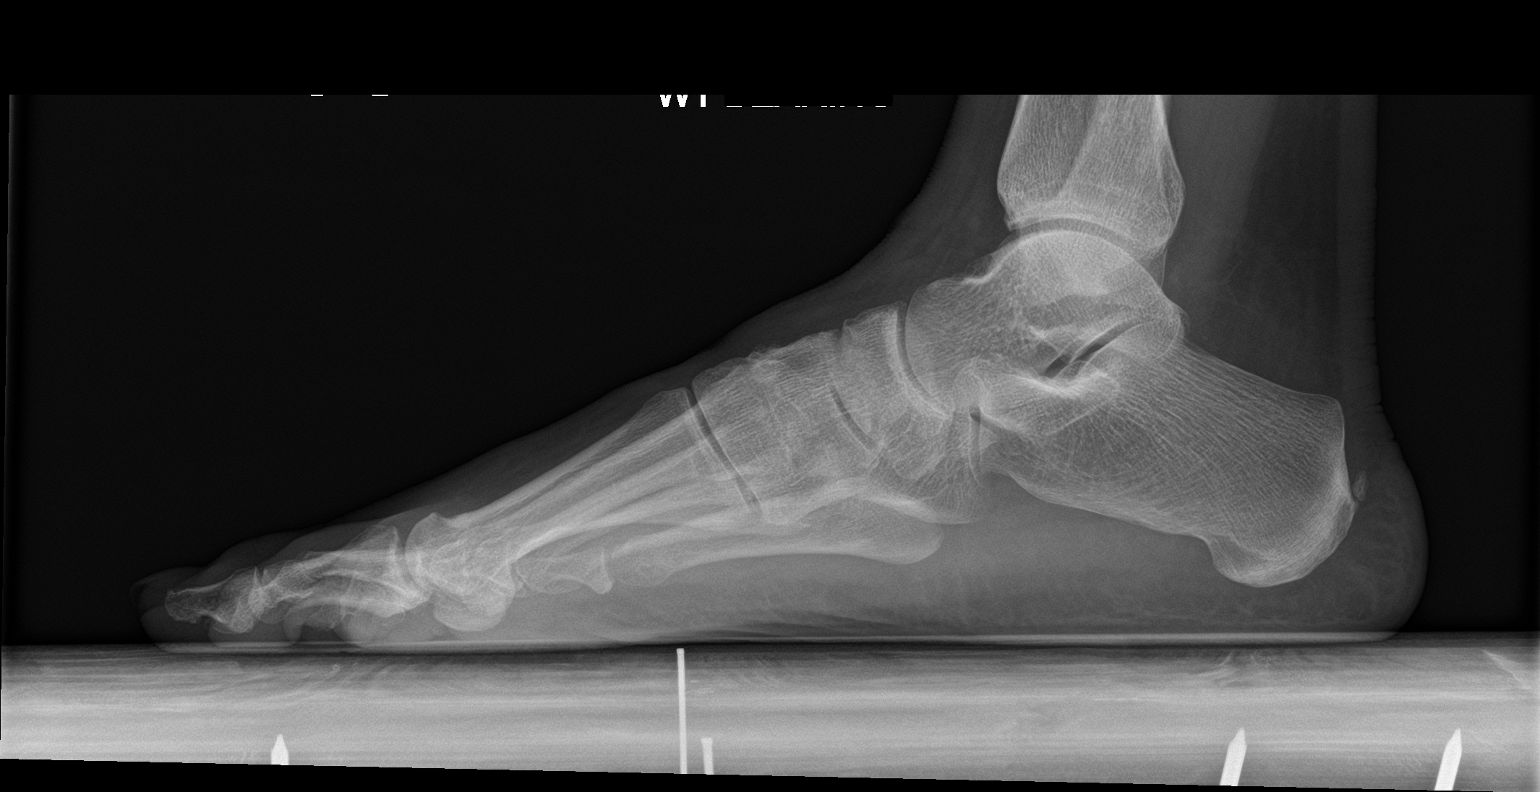

[3 of 3 positions shown; findings below may reference images not displayed]

FINDINGS: Normal bone mineralization. Minimal lateral great toe
metatarsophalangeal joint space narrowing and minimal lateral great
toe metatarsal head subchondral degenerative cystic change. Minimal
chronic enthesopathic change at the Achilles insertion on the
calcaneus. No acute fracture or dislocation.
IMPRESSION: Minimal great toe metatarsophalangeal joint osteoarthritis.

## 2023-12-19 ENCOUNTER — Ambulatory Visit (HOSPITAL_BASED_OUTPATIENT_CLINIC_OR_DEPARTMENT_OTHER)
Admission: RE | Admit: 2023-12-19 | Discharge: 2023-12-19 | Disposition: A | Discharge status: Home / Self Care | Payer: Managed Care, Other (non HMO) | Source: Ambulatory Visit | Attending: Medical-Surgical | Admitting: Medical-Surgical

## 2023-12-19 ENCOUNTER — Encounter (HOSPITAL_BASED_OUTPATIENT_CLINIC_OR_DEPARTMENT_OTHER): Payer: Self-pay

## 2023-12-19 DIAGNOSIS — Z1231 Encounter for screening mammogram for malignant neoplasm of breast: Secondary | ICD-10-CM | POA: Diagnosis present

## 2023-12-19 DIAGNOSIS — Z139 Encounter for screening, unspecified: Secondary | ICD-10-CM

## 2023-12-24 ENCOUNTER — Encounter: Payer: Self-pay | Admitting: Medical-Surgical

## 2024-02-27 ENCOUNTER — Encounter: Payer: Self-pay | Admitting: Medical-Surgical

## 2024-02-27 DIAGNOSIS — F411 Generalized anxiety disorder: Secondary | ICD-10-CM

## 2024-02-27 DIAGNOSIS — G4700 Insomnia, unspecified: Secondary | ICD-10-CM

## 2024-02-27 DIAGNOSIS — F988 Other specified behavioral and emotional disorders with onset usually occurring in childhood and adolescence: Secondary | ICD-10-CM

## 2024-02-28 ENCOUNTER — Encounter: Payer: Self-pay | Admitting: Medical-Surgical

## 2024-02-28 DIAGNOSIS — F988 Other specified behavioral and emotional disorders with onset usually occurring in childhood and adolescence: Secondary | ICD-10-CM | POA: Insufficient documentation

## 2024-02-28 DIAGNOSIS — F411 Generalized anxiety disorder: Secondary | ICD-10-CM | POA: Insufficient documentation

## 2024-02-28 DIAGNOSIS — G4700 Insomnia, unspecified: Secondary | ICD-10-CM | POA: Insufficient documentation

## 2024-02-28 HISTORY — DX: Other specified behavioral and emotional disorders with onset usually occurring in childhood and adolescence: F98.8

## 2024-02-28 NOTE — Telephone Encounter (Signed)
 Medications added to pt's chart. Denece Finger, CMA

## 2024-03-04 ENCOUNTER — Ambulatory Visit: Payer: Managed Care, Other (non HMO) | Admitting: Medical-Surgical

## 2024-07-05 ENCOUNTER — Other Ambulatory Visit: Payer: Self-pay | Admitting: Medical-Surgical

## 2024-07-05 DIAGNOSIS — J301 Allergic rhinitis due to pollen: Secondary | ICD-10-CM

## 2024-07-09 LAB — HM DIABETES EYE EXAM

## 2024-07-27 LAB — HEMOGLOBIN A1C: A1c: 5.6

## 2024-07-27 LAB — COMPREHENSIVE METABOLIC PANEL WITH GFR

## 2024-08-04 ENCOUNTER — Other Ambulatory Visit: Payer: Self-pay | Admitting: Medical-Surgical

## 2024-08-04 DIAGNOSIS — J301 Allergic rhinitis due to pollen: Secondary | ICD-10-CM

## 2024-08-09 ENCOUNTER — Other Ambulatory Visit: Payer: Self-pay | Admitting: Medical-Surgical

## 2024-08-09 DIAGNOSIS — R7303 Prediabetes: Secondary | ICD-10-CM

## 2024-08-12 ENCOUNTER — Encounter: Payer: Self-pay | Admitting: Medical-Surgical

## 2024-09-03 ENCOUNTER — Other Ambulatory Visit: Payer: Self-pay | Admitting: Medical-Surgical

## 2024-09-03 DIAGNOSIS — I10 Essential (primary) hypertension: Secondary | ICD-10-CM

## 2024-09-12 ENCOUNTER — Other Ambulatory Visit: Payer: Self-pay | Admitting: Medical-Surgical

## 2024-09-12 DIAGNOSIS — R7303 Prediabetes: Secondary | ICD-10-CM

## 2024-10-08 ENCOUNTER — Other Ambulatory Visit: Payer: Self-pay | Admitting: Medical-Surgical

## 2024-10-08 DIAGNOSIS — I10 Essential (primary) hypertension: Secondary | ICD-10-CM

## 2024-12-31 ENCOUNTER — Encounter: Admitting: Medical-Surgical
